# Patient Record
Sex: Male | Born: 1970 | Race: White | Hispanic: No | Marital: Married | Smoking: Never smoker
Health system: Southern US, Community
[De-identification: ages and names within clinical notes are randomized; demographics above are authoritative.]

## PROBLEM LIST (undated history)

## (undated) DIAGNOSIS — R0602 Shortness of breath: Secondary | ICD-10-CM

## (undated) DIAGNOSIS — M25472 Effusion, left ankle: Secondary | ICD-10-CM

## (undated) DIAGNOSIS — M25569 Pain in unspecified knee: Secondary | ICD-10-CM

## (undated) DIAGNOSIS — Z87442 Personal history of urinary calculi: Secondary | ICD-10-CM

## (undated) DIAGNOSIS — E785 Hyperlipidemia, unspecified: Secondary | ICD-10-CM

## (undated) DIAGNOSIS — Z8619 Personal history of other infectious and parasitic diseases: Secondary | ICD-10-CM

## (undated) DIAGNOSIS — M1990 Unspecified osteoarthritis, unspecified site: Secondary | ICD-10-CM

## (undated) DIAGNOSIS — K5900 Constipation, unspecified: Secondary | ICD-10-CM

## (undated) DIAGNOSIS — I10 Essential (primary) hypertension: Secondary | ICD-10-CM

## (undated) DIAGNOSIS — N200 Calculus of kidney: Secondary | ICD-10-CM

## (undated) DIAGNOSIS — M25475 Effusion, left foot: Secondary | ICD-10-CM

## (undated) DIAGNOSIS — E669 Obesity, unspecified: Secondary | ICD-10-CM

## (undated) DIAGNOSIS — R7301 Impaired fasting glucose: Secondary | ICD-10-CM

## (undated) DIAGNOSIS — Z8709 Personal history of other diseases of the respiratory system: Secondary | ICD-10-CM

## (undated) DIAGNOSIS — R5383 Other fatigue: Secondary | ICD-10-CM

## (undated) DIAGNOSIS — G4730 Sleep apnea, unspecified: Secondary | ICD-10-CM

## (undated) DIAGNOSIS — T7840XA Allergy, unspecified, initial encounter: Secondary | ICD-10-CM

## (undated) HISTORY — PX: WISDOM TOOTH EXTRACTION: SHX21

## (undated) HISTORY — DX: Shortness of breath: R06.02

## (undated) HISTORY — DX: Personal history of other diseases of the respiratory system: Z87.09

## (undated) HISTORY — DX: Effusion, left foot: M25.475

## (undated) HISTORY — DX: Personal history of urinary calculi: Z87.442

## (undated) HISTORY — DX: Hyperlipidemia, unspecified: E78.5

## (undated) HISTORY — DX: Unspecified osteoarthritis, unspecified site: M19.90

## (undated) HISTORY — DX: Pain in unspecified knee: M25.569

## (undated) HISTORY — DX: Personal history of other infectious and parasitic diseases: Z86.19

## (undated) HISTORY — DX: Essential (primary) hypertension: I10

## (undated) HISTORY — PX: FOOT SURGERY: SHX648

## (undated) HISTORY — DX: Sleep apnea, unspecified: G47.30

## (undated) HISTORY — DX: Obesity, unspecified: E66.9

## (undated) HISTORY — PX: OTHER SURGICAL HISTORY: SHX169

## (undated) HISTORY — DX: Impaired fasting glucose: R73.01

## (undated) HISTORY — PX: HERNIA REPAIR: SHX51

## (undated) HISTORY — DX: Other fatigue: R53.83

## (undated) HISTORY — DX: Allergy, unspecified, initial encounter: T78.40XA

## (undated) HISTORY — DX: Constipation, unspecified: K59.00

## (undated) HISTORY — DX: Effusion, left ankle: M25.472

## (undated) HISTORY — PX: APPENDECTOMY: SHX54

---

## 2012-11-06 ENCOUNTER — Ambulatory Visit (INDEPENDENT_AMBULATORY_CARE_PROVIDER_SITE_OTHER): Payer: BC Managed Care – PPO | Admitting: Family Medicine

## 2012-11-06 ENCOUNTER — Encounter: Payer: Self-pay | Admitting: Family Medicine

## 2012-11-06 VITALS — BP 123/85 | HR 80 | Wt 251.0 lb

## 2012-11-06 DIAGNOSIS — R7402 Elevation of levels of lactic acid dehydrogenase (LDH): Secondary | ICD-10-CM

## 2012-11-06 DIAGNOSIS — E8881 Metabolic syndrome: Secondary | ICD-10-CM

## 2012-11-06 DIAGNOSIS — R7401 Elevation of levels of liver transaminase levels: Secondary | ICD-10-CM | POA: Insufficient documentation

## 2012-11-06 DIAGNOSIS — E785 Hyperlipidemia, unspecified: Secondary | ICD-10-CM

## 2012-11-06 DIAGNOSIS — E88819 Insulin resistance, unspecified: Secondary | ICD-10-CM

## 2012-11-06 DIAGNOSIS — E669 Obesity, unspecified: Secondary | ICD-10-CM

## 2012-11-06 MED ORDER — ROSUVASTATIN CALCIUM 40 MG PO TABS: ORAL_TABLET | ORAL | Status: DC

## 2012-11-06 NOTE — Progress Notes (Signed)
Subjective:     Patient ID: Ronald Hansen, male   DOB: 1971-03-07, 42 y.o.   MRN: [id number]  HPI Ronald Hansen is here today to go over his most recent lab results.  He has done well since his last office visit.   Review of Systems  Constitutional: Negative for unexpected weight change.  Cardiovascular: Negative for chest pain and leg swelling.  Endocrine: Negative for polydipsia, polyphagia and polyuria.       Objective:   Physical Exam  Constitutional: He appears well-nourished. No distress.  Cardiovascular: Normal rate and regular rhythm.   Pulmonary/Chest: Effort normal and breath sounds normal.  Musculoskeletal: He exhibits no edema.  Skin:  Multiple tattoos       Assessment:     Hyperlipidemia Elevated Transaminase     Plan:     He is going to start on Crestor and we'll recheck his labs in 4 months.  He is to limit his intake of alcohol and is to follow a low carb diet.

## 2012-11-06 NOTE — Patient Instructions (Addendum)
1)  Cholesterol -   A)  Low Fat/Low Carb/High Protein Diet plus 1 hour of exercise per day.    B)  Crestor - Start with 10 mg at night and increase to 20 mg.  We'll recheck your NMR after you have been on the 20 mg for a full 3 months.     2)  Elevated Liver Enzyme-  Take 400 IU of Vitamin E twice a day till your next blood draw.  Avoid alcohol and excessive pain meds like Ibuprofen and Tylenol. Weight loss will help this.  Just to be sure that you don't need to worry about Hepatitis - Go to the [employer] and donate blood.    3)  Insulin Resistance - See above.      Fat and Cholesterol Control Diet Cholesterol levels in your body are determined significantly by your diet. Cholesterol levels may also be related to heart disease. The following material helps to explain this relationship and discusses what you can do to help keep your heart healthy. Not all cholesterol is bad. Low-density lipoprotein (LDL) cholesterol is the "bad" cholesterol. It may cause fatty deposits to build up inside your arteries. High-density lipoprotein (HDL) cholesterol is "good." It helps to remove the "bad" LDL cholesterol from your blood. Cholesterol is a very important risk factor for heart disease. Other risk factors are high blood pressure, smoking, stress, heredity, and weight. The heart muscle gets its supply of blood through the coronary arteries. If your LDL cholesterol is high and your HDL cholesterol is low, you are at risk for having fatty deposits build up in your coronary arteries. This leaves less room through which blood can flow. Without sufficient blood and oxygen, the heart muscle cannot function properly and you may feel chest pains (angina pectoris). When a coronary artery closes up entirely, a part of the heart muscle may die causing a heart attack (myocardial infarction). CHECKING CHOLESTEROL When your caregiver sends your blood to a lab to be examined for cholesterol, a complete lipid (fat)  profile may be done. With this test, the total amount of cholesterol and levels of LDL and HDL are determined. Triglycerides are a type of fat that circulates in the blood. They can also be used to determine heart disease risk. The list below describes what the numbers should be: Test: Total Cholesterol.  Less than 200 mg/dl. Test: LDL "bad cholesterol."  Less than 100 mg/dl.  Less than 70 mg/dl if you are at very high risk of a heart attack or sudden cardiac death. Test: HDL "good cholesterol."  Greater than 50 mg/dl for women.  Greater than 40 mg/dl for men. Test: Triglycerides.  Less than 150 mg/dl. CONTROLLING CHOLESTEROL WITH DIET Although exercise and lifestyle factors are important, your diet is key. That is because certain foods are known to raise cholesterol and others to lower it. The goal is to balance foods for their effect on cholesterol and more importantly, to replace saturated and trans fat with other types of fat, such as monounsaturated fat, polyunsaturated fat, and omega-3 fatty acids. On average, a person should consume no more than 15 to 17 g of saturated fat daily. Saturated and trans fats are considered "bad" fats, and they will raise LDL cholesterol. Saturated fats are primarily found in animal products such as meats, butter, and cream. However, that does not mean you need to give up all your favorite foods. Today, there are good tasting, low-fat, low-cholesterol substitutes for most of the things you  like to eat. Choose low-fat or nonfat alternatives. Choose round or loin cuts of red meat. These types of cuts are lowest in fat and cholesterol. Chicken (without the skin), fish, veal, and ground [place] breast are great choices. Eliminate fatty meats, such as hot dogs and salami. Even shellfish have little or no saturated fat. Have a 3 oz (85 g) portion when you eat lean meat, poultry, or fish. Trans fats are also called "partially hydrogenated oils." They are oils that have  been scientifically manipulated so that they are solid at room temperature resulting in a longer shelf life and improved taste and texture of foods in which they are added. Trans fats are found in stick margarine, some tub margarines, cookies, crackers, and baked goods.  When baking and cooking, oils are a great substitute for butter. The monounsaturated oils are especially beneficial since it is believed they lower LDL and raise HDL. The oils you should avoid entirely are saturated tropical oils, such as coconut and palm.  Remember to eat a lot from food groups that are naturally free of saturated and trans fat, including fish, fruit, vegetables, beans, grains (barley, rice, couscous, bulgur wheat), and pasta (without cream sauces).  IDENTIFYING FOODS THAT LOWER CHOLESTEROL  Soluble fiber may lower your cholesterol. This type of fiber is found in fruits such as apples, vegetables such as broccoli, potatoes, and carrots, legumes such as beans, peas, and lentils, and grains such as barley. Foods fortified with plant sterols (phytosterol) may also lower cholesterol. You should eat at least 2 g per day of these foods for a cholesterol lowering effect.  Read package labels to identify low-saturated fats, trans fat free, and low-fat foods at the supermarket. Select cheeses that have only 2 to 3 g saturated fat per ounce. Use a heart-healthy tub margarine that is free of trans fats or partially hydrogenated oil. When buying baked goods (cookies, crackers), avoid partially hydrogenated oils. Breads and muffins should be made from whole grains (whole-wheat or whole oat flour, instead of "flour" or "enriched flour"). Buy non-creamy canned soups with reduced salt and no added fats.  FOOD PREPARATION TECHNIQUES  Never deep-fry. If you must fry, either stir-fry, which uses very little fat, or use non-stick cooking sprays. When possible, broil, bake, or roast meats, and steam vegetables. Instead of putting butter or  margarine on vegetables, use lemon and herbs, applesauce, and cinnamon (for squash and sweet potatoes), nonfat yogurt, salsa, and low-fat dressings for salads.  LOW-SATURATED FAT / LOW-FAT FOOD SUBSTITUTES Meats / Saturated Fat (g)  Avoid: Steak, marbled (3 oz/85 g) / 11 g  Choose: Steak, lean (3 oz/85 g) / 4 g  Avoid: Hamburger (3 oz/85 g) / 7 g  Choose: Hamburger, lean (3 oz/85 g) / 5 g  Avoid: Ham (3 oz/85 g) / 6 g  Choose: Ham, lean cut (3 oz/85 g) / 2.4 g  Avoid: Chicken, with skin, dark meat (3 oz/85 g) / 4 g  Choose: Chicken, skin removed, dark meat (3 oz/85 g) / 2 g  Avoid: Chicken, with skin, light meat (3 oz/85 g) / 2.5 g  Choose: Chicken, skin removed, light meat (3 oz/85 g) / 1 g Dairy / Saturated Fat (g)  Avoid: Whole milk (1 cup) / 5 g  Choose: Low-fat milk, 2% (1 cup) / 3 g  Choose: Low-fat milk, 1% (1 cup) / 1.5 g  Choose: Skim milk (1 cup) / 0.3 g  Avoid: Hard cheese (1 oz/28 g) / 6 g  Choose: Skim milk cheese (1 oz/28 g) / 2 to 3 g  Avoid: Cottage cheese, 4% fat (1 cup) / 6.5 g  Choose: Low-fat cottage cheese, 1% fat (1 cup) / 1.5 g  Avoid: Ice cream (1 cup) / 9 g  Choose: Sherbet (1 cup) / 2.5 g  Choose: Nonfat frozen yogurt (1 cup) / 0.3 g  Choose: Frozen fruit bar / trace  Avoid: Whipped cream (1 tbs) / 3.5 g  Choose: Nondairy whipped topping (1 tbs) / 1 g Condiments / Saturated Fat (g)  Avoid: Mayonnaise (1 tbs) / 2 g  Choose: Low-fat mayonnaise (1 tbs) / 1 g  Avoid: Butter (1 tbs) / 7 g  Choose: Extra light margarine (1 tbs) / 1 g  Avoid: Coconut oil (1 tbs) / 11.8 g  Choose: Olive oil (1 tbs) / 1.8 g  Choose: Corn oil (1 tbs) / 1.7 g  Choose: Safflower oil (1 tbs) / 1.2 g  Choose: Sunflower oil (1 tbs) / 1.4 g  Choose: Soybean oil (1 tbs) / 2.4 g  Choose: Canola oil (1 tbs) / 1 g Document Released: 07/23/2005 Document Revised: 10/15/2011 Document Reviewed: 01/11/2011 [facility] Patient Information 2013 [place],  [place].

## 2012-11-08 ENCOUNTER — Encounter: Payer: Self-pay | Admitting: Family Medicine

## 2012-12-18 ENCOUNTER — Other Ambulatory Visit: Payer: Self-pay | Admitting: *Deleted

## 2012-12-18 NOTE — Telephone Encounter (Signed)
Called [facility] to confirm the refill for Crestor for 90 tablets with 3 refills. PG

## 2013-03-09 ENCOUNTER — Other Ambulatory Visit: Payer: Self-pay | Admitting: *Deleted

## 2013-03-09 DIAGNOSIS — E785 Hyperlipidemia, unspecified: Secondary | ICD-10-CM

## 2013-03-11 ENCOUNTER — Other Ambulatory Visit: Payer: BC Managed Care – PPO

## 2013-03-18 ENCOUNTER — Ambulatory Visit: Payer: BC Managed Care – PPO | Admitting: Family Medicine

## 2014-02-14 ENCOUNTER — Encounter (HOSPITAL_BASED_OUTPATIENT_CLINIC_OR_DEPARTMENT_OTHER): Payer: Self-pay | Admitting: Emergency Medicine

## 2014-02-14 ENCOUNTER — Emergency Department (HOSPITAL_BASED_OUTPATIENT_CLINIC_OR_DEPARTMENT_OTHER)
Admission: EM | Admit: 2014-02-14 | Discharge: 2014-02-14 | Disposition: A | Discharge status: Home / Self Care | Payer: BC Managed Care – PPO | Attending: Emergency Medicine | Admitting: Emergency Medicine

## 2014-02-14 ENCOUNTER — Emergency Department (HOSPITAL_BASED_OUTPATIENT_CLINIC_OR_DEPARTMENT_OTHER): Payer: BC Managed Care – PPO

## 2014-02-14 DIAGNOSIS — Z791 Long term (current) use of non-steroidal anti-inflammatories (NSAID): Secondary | ICD-10-CM | POA: Insufficient documentation

## 2014-02-14 DIAGNOSIS — E785 Hyperlipidemia, unspecified: Secondary | ICD-10-CM | POA: Insufficient documentation

## 2014-02-14 DIAGNOSIS — E669 Obesity, unspecified: Secondary | ICD-10-CM | POA: Insufficient documentation

## 2014-02-14 DIAGNOSIS — Z8739 Personal history of other diseases of the musculoskeletal system and connective tissue: Secondary | ICD-10-CM | POA: Insufficient documentation

## 2014-02-14 DIAGNOSIS — N23 Unspecified renal colic: Secondary | ICD-10-CM | POA: Insufficient documentation

## 2014-02-14 DIAGNOSIS — Z79899 Other long term (current) drug therapy: Secondary | ICD-10-CM | POA: Insufficient documentation

## 2014-02-14 DIAGNOSIS — Z880 Allergy status to penicillin: Secondary | ICD-10-CM | POA: Insufficient documentation

## 2014-02-14 HISTORY — DX: Calculus of kidney: N20.0

## 2014-02-14 HISTORY — DX: Unspecified osteoarthritis, unspecified site: M19.90

## 2014-02-14 LAB — CBC WITH DIFFERENTIAL/PLATELET
Basophils Absolute: 0 10*3/uL (ref 0.0–0.1)
Basophils Relative: 1 % (ref 0–1)
EOS ABS: 0.2 10*3/uL (ref 0.0–0.7)
Eosinophils Relative: 2 % (ref 0–5)
HCT: 46.8 % (ref 39.0–52.0)
Hemoglobin: 16.6 g/dL (ref 13.0–17.0)
LYMPHS ABS: 1.7 10*3/uL (ref 0.7–4.0)
Lymphocytes Relative: 23 % (ref 12–46)
MCH: 31.1 pg (ref 26.0–34.0)
MCHC: 35.5 g/dL (ref 30.0–36.0)
MCV: 87.8 fL (ref 78.0–100.0)
MONOS PCT: 9 % (ref 3–12)
Monocytes Absolute: 0.6 10*3/uL (ref 0.1–1.0)
NEUTROS ABS: 4.8 10*3/uL (ref 1.7–7.7)
Neutrophils Relative %: 66 % (ref 43–77)
Platelets: 205 10*3/uL (ref 150–400)
RBC: 5.33 MIL/uL (ref 4.22–5.81)
RDW: 12.3 % (ref 11.5–15.5)
WBC: 7.3 10*3/uL (ref 4.0–10.5)

## 2014-02-14 LAB — URINE MICROSCOPIC-ADD ON

## 2014-02-14 LAB — URINALYSIS, ROUTINE W REFLEX MICROSCOPIC
Bilirubin Urine: NEGATIVE
Glucose, UA: NEGATIVE mg/dL
Ketones, ur: NEGATIVE mg/dL
Leukocytes, UA: NEGATIVE
NITRITE: NEGATIVE
Protein, ur: NEGATIVE mg/dL
SPECIFIC GRAVITY, URINE: 1.02 (ref 1.005–1.030)
UROBILINOGEN UA: 0.2 mg/dL (ref 0.0–1.0)
pH: 6 (ref 5.0–8.0)

## 2014-02-14 LAB — BASIC METABOLIC PANEL
Anion gap: 12 (ref 5–15)
BUN: 9 mg/dL (ref 6–23)
CHLORIDE: 102 meq/L (ref 96–112)
CO2: 28 meq/L (ref 19–32)
Calcium: 9.7 mg/dL (ref 8.4–10.5)
Creatinine, Ser: 1 mg/dL (ref 0.50–1.35)
GFR calc Af Amer: >90 mL/min (ref >90)
GFR calc non Af Amer: >90 mL/min (ref >90)
GLUCOSE: 125 mg/dL — AB (ref 70–99)
POTASSIUM: 3.7 meq/L (ref 3.7–5.3)
Sodium: 142 mEq/L (ref 137–147)

## 2014-02-14 MED ORDER — KETOROLAC TROMETHAMINE 30 MG/ML IJ SOLN
30.0000 mg | Freq: Once | INTRAMUSCULAR | Status: AC
  Administered 2014-02-14: 30 mg via INTRAVENOUS
  Filled 2014-02-14: qty 1

## 2014-02-14 MED ORDER — OXYCODONE-ACETAMINOPHEN 5-325 MG PO TABS: 2.0000 | ORAL_TABLET | ORAL | Status: DC | PRN

## 2014-02-14 MED ORDER — MORPHINE SULFATE 4 MG/ML IJ SOLN
4.0000 mg | Freq: Once | INTRAMUSCULAR | Status: AC
  Administered 2014-02-14: 4 mg via INTRAVENOUS
  Filled 2014-02-14: qty 1

## 2014-02-14 MED ORDER — ONDANSETRON HCL 4 MG/2ML IJ SOLN
4.0000 mg | Freq: Once | INTRAMUSCULAR | Status: AC
  Administered 2014-02-14: 4 mg via INTRAVENOUS
  Filled 2014-02-14: qty 2

## 2014-02-14 MED ORDER — ONDANSETRON HCL 4 MG PO TABS: 4.0000 mg | ORAL_TABLET | Freq: Four times a day (QID) | ORAL | Status: DC

## 2014-02-14 MED ORDER — IBUPROFEN 800 MG PO TABS: 800.0000 mg | ORAL_TABLET | Freq: Three times a day (TID) | ORAL | Status: DC

## 2014-02-14 MED ORDER — SODIUM CHLORIDE 0.9 % IV BOLUS (SEPSIS)
1000.0000 mL | Freq: Once | INTRAVENOUS | Status: AC
  Administered 2014-02-14: 1000 mL via INTRAVENOUS

## 2014-02-14 NOTE — Discharge Instructions (Signed)

## 2014-02-14 NOTE — ED Notes (Signed)
Pt reports pain in L flank with radiation to groin area, dysuria and blood in urine.  Reports hx of kidney stones.  Also has had constipation.

## 2014-02-14 NOTE — ED Provider Notes (Signed)
CSN: [id number]     Arrival date & time 02/14/14  1612 History  This chart was scribed for Glynn OctaveStephen Tayveon Lombardo, MD by Modena JanskyAlbert Thayil, ED Scribe. This patient was seen in room MH01/MH01 and the patient's care was started at 4:30 PM.  Chief Complaint  Patient presents with  . Flank Pain    HPI HPI Comments: Ronald Hansen is a 43 y.o. male with a hx of kidney stones who presents to the Emergency Department complaining of intermittent moderate left flank abdominal pain that started two days ago. He states that the pain radiates that his groin area. He reports associated dysuria and hematuria. He also reports associated chills. He reports that his symptoms feel similar to prior episodes of kidney stones He states that he has a hx of an appendectomy. He denies any emesis or testicular pain. He states that he does not have a urologist.    PCP- Dr. Alberteen SamZanard Past Medical History  Diagnosis Date  . Hyperlipidemia   . Obesity   . Impaired fasting glucose   . Kidney stone   . Arthritis    Past Surgical History  Procedure Laterality Date  . Appendectomy    . Cyst removal breast     Family History  Problem Relation Age of Onset  . COPD Father   . Cancer Father 1250    Prostate Cancer   . Hyperlipidemia Paternal Grandmother    History  Substance Use Topics  . Smoking status: Never Smoker   . Smokeless tobacco: Not on file  . Alcohol Use: Yes     Comment: occ    Review of Systems  A complete 10 system review of systems was obtained and all systems are negative except as noted in the HPI and PMH.   Allergies  Aspirin and Penicillins  Home Medications   Prior to Admission medications   Medication Sig Start Date End Date Taking? Authorizing Provider  ibuprofen (ADVIL,MOTRIN) 800 MG tablet Take 1 tablet (800 mg total) by mouth 3 (three) times daily. 02/14/14   Glynn OctaveStephen Derrien Anschutz, MD  ondansetron (ZOFRAN) 4 MG tablet Take 1 tablet (4 mg total) by mouth every 6 (six) hours. 02/14/14   Glynn OctaveStephen  Briyah Wheelwright, MD  oxyCODONE-acetaminophen (PERCOCET/ROXICET) 5-325 MG per tablet Take 2 tablets by mouth every 4 (four) hours as needed for severe pain. 02/14/14   Glynn OctaveStephen Jonisha Kindig, MD  rosuvastatin (CRESTOR) 40 MG tablet Take 1/2 - 1 tab daily 11/06/12   Gillian Scarceobyn K Zanard, MD   BP 156/103  Pulse 90  Temp(Src) 97.7 F (36.5 C) (Oral)  Resp 18  SpO2 99% Physical Exam  Nursing note and vitals reviewed. Constitutional: He is oriented to person, place, and time. He appears well-developed and well-nourished. No distress.  Uncomfortable.  HENT:  Head: Normocephalic and atraumatic.  Mouth/Throat: Oropharynx is clear and moist. No oropharyngeal exudate.  Eyes: Conjunctivae and EOM are normal. Pupils are equal, round, and reactive to light.  Neck: Normal range of motion. Neck supple.  No meningismus.  Cardiovascular: Normal rate, regular rhythm, normal heart sounds and intact distal pulses.   No murmur heard. Pulmonary/Chest: Effort normal and breath sounds normal. No respiratory distress.  Abdominal: Soft. There is tenderness. There is no rebound and no guarding.  Left lower abdominal pain with no guarding.  Genitourinary:  No testicular pain.  Musculoskeletal: Normal range of motion. He exhibits tenderness. He exhibits no edema.  Left paraspinal lumbar tenderness.   Neurological: He is alert and oriented to person, place, and time.  No cranial nerve deficit. He exhibits normal muscle tone. Coordination normal.  No ataxia on finger to nose bilaterally. No pronator drift. 5/5 strength throughout. CN 2-12 intact. Negative Romberg. Equal grip strength. Sensation intact. Gait is normal.   Skin: Skin is warm.  Psychiatric: He has a normal mood and affect. His behavior is normal.    ED Course  Procedures (including critical care time) DIAGNOSTIC STUDIES: Oxygen Saturation is 99% on RA, normal by my interpretation.    COORDINATION OF CARE: 4:34 PM- Pt advised of plan for treatment which includes  medication, labs, and radiology and pt agrees.  Labs Review Labs Reviewed  URINALYSIS, ROUTINE W REFLEX MICROSCOPIC - Abnormal; Notable for the following:    Hgb urine dipstick MODERATE (*)    All other components within normal limits  BASIC METABOLIC PANEL - Abnormal; Notable for the following:    Glucose, Bld 125 (*)    All other components within normal limits  URINE MICROSCOPIC-ADD ON - Abnormal; Notable for the following:    Bacteria, UA FEW (*)    All other components within normal limits  CBC WITH DIFFERENTIAL    Imaging Review Ct Abdomen Pelvis Wo Contrast  02/14/2014   CLINICAL DATA:  Left flank pain radiating to the groin.  Dysuria.  EXAM: CT ABDOMEN AND PELVIS WITHOUT CONTRAST  TECHNIQUE: Multidetector CT imaging of the abdomen and pelvis was performed following the standard protocol without IV contrast.  COMPARISON:  None.  FINDINGS: The lung bases are clear.  No pleural or pericardial effusion.  3 nonobstructing left renal stones are identified measuring up to 0.4 cm in diameter. There is mild left hydronephrosis with some stranding about the left kidney and ureter due to a 0.2 cm distal left ureteral stone. No right renal stones are seen.  The gallbladder, liver, spleen, adrenal glands, pancreas and biliary tree all appear normal. Fat containing inguinal hernias are seen, larger on the left. The stomach and small and large bowel appear normal. Bones demonstrate bilateral L5 pars interarticularis defects with associated trace anterolisthesis L5 on S1. No lytic or sclerotic bony lesion is seen.  IMPRESSION: Mild left hydronephrosis due to a 0.2 cm distal left ureteral stone. Three nonobstructing stones are also seen in the left kidney.  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  Fat containing inguinal hernias, larger on the left.   Electronically Signed   By: Drusilla Kanner M.D.   On: 02/14/2014 17:44     EKG Interpretation None       MDM   Final diagnoses:  Ureteral colic   intermittent left flank pain for the past day with hematuria. Similar to previous kidney stones. No vomiting or fever. No testicular pain  UA with hematuria, no infection  CT scan shows distal 2 mm stone on the left with hydronephrosis. Creatinine is normal. Pain well-controlled in the ED. No vomiting. Tolerating by mouth. Expect patient to pass stone without intervention. Will treat symptoms and refer to urology. Return precautions discussed  I personally performed the services described in this documentation, which was scribed in my presence. The recorded information has been reviewed and is accurate.     Glynn Octave, MD 02/14/14 [phone]

## 2015-08-28 ENCOUNTER — Emergency Department (HOSPITAL_BASED_OUTPATIENT_CLINIC_OR_DEPARTMENT_OTHER)
Admission: EM | Admit: 2015-08-28 | Discharge: 2015-08-28 | Disposition: A | Discharge status: Home / Self Care | Payer: BLUE CROSS/BLUE SHIELD | Attending: Emergency Medicine | Admitting: Emergency Medicine

## 2015-08-28 ENCOUNTER — Emergency Department (HOSPITAL_BASED_OUTPATIENT_CLINIC_OR_DEPARTMENT_OTHER): Payer: BLUE CROSS/BLUE SHIELD

## 2015-08-28 ENCOUNTER — Encounter (HOSPITAL_BASED_OUTPATIENT_CLINIC_OR_DEPARTMENT_OTHER): Payer: Self-pay | Admitting: *Deleted

## 2015-08-28 DIAGNOSIS — Z791 Long term (current) use of non-steroidal anti-inflammatories (NSAID): Secondary | ICD-10-CM | POA: Diagnosis not present

## 2015-08-28 DIAGNOSIS — Z79899 Other long term (current) drug therapy: Secondary | ICD-10-CM | POA: Diagnosis not present

## 2015-08-28 DIAGNOSIS — N50819 Testicular pain, unspecified: Secondary | ICD-10-CM | POA: Diagnosis not present

## 2015-08-28 DIAGNOSIS — M1990 Unspecified osteoarthritis, unspecified site: Secondary | ICD-10-CM | POA: Insufficient documentation

## 2015-08-28 DIAGNOSIS — K4090 Unilateral inguinal hernia, without obstruction or gangrene, not specified as recurrent: Secondary | ICD-10-CM | POA: Diagnosis not present

## 2015-08-28 DIAGNOSIS — K5900 Constipation, unspecified: Secondary | ICD-10-CM | POA: Insufficient documentation

## 2015-08-28 DIAGNOSIS — R1032 Left lower quadrant pain: Secondary | ICD-10-CM

## 2015-08-28 DIAGNOSIS — R197 Diarrhea, unspecified: Secondary | ICD-10-CM | POA: Diagnosis not present

## 2015-08-28 DIAGNOSIS — E669 Obesity, unspecified: Secondary | ICD-10-CM | POA: Diagnosis not present

## 2015-08-28 DIAGNOSIS — E785 Hyperlipidemia, unspecified: Secondary | ICD-10-CM | POA: Diagnosis not present

## 2015-08-28 DIAGNOSIS — Z880 Allergy status to penicillin: Secondary | ICD-10-CM | POA: Diagnosis not present

## 2015-08-28 DIAGNOSIS — Z87442 Personal history of urinary calculi: Secondary | ICD-10-CM | POA: Insufficient documentation

## 2015-08-28 DIAGNOSIS — R1030 Lower abdominal pain, unspecified: Secondary | ICD-10-CM | POA: Diagnosis present

## 2015-08-28 LAB — COMPREHENSIVE METABOLIC PANEL
ALK PHOS: 83 U/L (ref 38–126)
ALT: 36 U/L (ref 17–63)
AST: 24 U/L (ref 15–41)
Albumin: 3.8 g/dL (ref 3.5–5.0)
Anion gap: 7 (ref 5–15)
BUN: 15 mg/dL (ref 6–20)
CALCIUM: 8.8 mg/dL — AB (ref 8.9–10.3)
CO2: 29 mmol/L (ref 22–32)
CREATININE: 0.93 mg/dL (ref 0.61–1.24)
Chloride: 104 mmol/L (ref 101–111)
GFR calc Af Amer: >60 mL/min (ref >60)
GFR calc non Af Amer: >60 mL/min (ref >60)
GLUCOSE: 113 mg/dL — AB (ref 65–99)
POTASSIUM: 4.1 mmol/L (ref 3.5–5.1)
Sodium: 140 mmol/L (ref 135–145)
Total Bilirubin: 0.7 mg/dL (ref 0.3–1.2)
Total Protein: 6.6 g/dL (ref 6.5–8.1)

## 2015-08-28 LAB — CBC WITH DIFFERENTIAL/PLATELET
BASOS ABS: 0 10*3/uL (ref 0.0–0.1)
Band Neutrophils: 0 %
Basophils Relative: 0 %
Blasts: 0 %
Eosinophils Absolute: 0.1 10*3/uL (ref 0.0–0.7)
Eosinophils Relative: 2 %
HCT: 45.5 % (ref 39.0–52.0)
Hemoglobin: 15.7 g/dL (ref 13.0–17.0)
LYMPHS PCT: 33 %
Lymphs Abs: 2.2 10*3/uL (ref 0.7–4.0)
MCH: 30.5 pg (ref 26.0–34.0)
MCHC: 34.5 g/dL (ref 30.0–36.0)
MCV: 88.5 fL (ref 78.0–100.0)
MONOS PCT: 8 %
Metamyelocytes Relative: 1 %
Monocytes Absolute: 0.5 10*3/uL (ref 0.1–1.0)
Myelocytes: 1 %
NEUTROS ABS: 3.8 10*3/uL (ref 1.7–7.7)
Neutrophils Relative %: 55 %
Other: 0 %
PROMYELOCYTES ABS: 0 %
Platelets: 186 10*3/uL (ref 150–400)
RBC: 5.14 MIL/uL (ref 4.22–5.81)
RDW: 12.7 % (ref 11.5–15.5)
WBC: 6.6 10*3/uL (ref 4.0–10.5)
nRBC: 0 /100 WBC

## 2015-08-28 MED ORDER — SODIUM CHLORIDE 0.9 % IV BOLUS (SEPSIS)
500.0000 mL | Freq: Once | INTRAVENOUS | Status: AC
  Administered 2015-08-28: 500 mL via INTRAVENOUS

## 2015-08-28 MED ORDER — IOHEXOL 350 MG/ML SOLN
85.0000 mL | Freq: Once | INTRAVENOUS | Status: AC | PRN
Start: 2015-08-28 — End: 2015-08-28
  Administered 2015-08-28: 100 mL via INTRAVENOUS

## 2015-08-28 MED ORDER — IOHEXOL 300 MG/ML  SOLN
25.0000 mL | Freq: Once | INTRAMUSCULAR | Status: AC | PRN
  Administered 2015-08-28: 25 mL via ORAL

## 2015-08-28 MED ORDER — ONDANSETRON HCL 4 MG/2ML IJ SOLN
4.0000 mg | Freq: Once | INTRAMUSCULAR | Status: AC
  Administered 2015-08-28: 4 mg via INTRAVENOUS
  Filled 2015-08-28: qty 2

## 2015-08-28 NOTE — ED Notes (Signed)
No difficulty voiding. Pain onset occurred while walking around.

## 2015-08-28 NOTE — ED Notes (Signed)
DC instructions reviewed with pt and significant other. Information sheet provided with numbers to inquire about obtaining a Primary Care MD, Discussed also the importance calling to make follow up appoint with [facility] as per EDP recommendations. Discussed and reviewed times that indicate return and seek assistance in the ED. Opportunity for questions provided. Teach Back Method used. Restricted activity work note provided by EDP and given to patient.

## 2015-08-28 NOTE — ED Notes (Signed)
Presents today with c/o pain at left groin, noted this past Thursday, Friday pm the pain was worse, unable to sleep and lay on left side. Describes as having a dull throb.

## 2015-08-28 NOTE — ED Provider Notes (Signed)
CSN: [id number]     Arrival date & time 08/28/15  1205 History   First MD Initiated Contact with Patient 08/28/15 1313     Chief Complaint  Patient presents with  . Groin Pain     (Consider location/radiation/quality/duration/timing/severity/associated sxs/prior Treatment) HPI Comments: Began Thursday night, twinges then Friday when it became more severe Yesterday at work was moving things, had sharp stabbing pain which was there and gone 3/10 right now, coming and going Yesterday stabbing pain while lifting, severe in left scrotum, radiates towards groin, then feel nausea, aching pain in groin "after effect" Worse with sneeze/lifting Feels different than kidney stones   Patient is a 45 y.o. male presenting with groin pain.  Groin Pain Pertinent negatives include no chest pain, no abdominal pain, no headaches and no shortness of breath.    Past Medical History  Diagnosis Date  . Hyperlipidemia   . Obesity   . Impaired fasting glucose   . Kidney stone   . Arthritis    Past Surgical History  Procedure Laterality Date  . Appendectomy    . Cyst removal breast    . Other surgical history Left     bone graft left ankle   Family History  Problem Relation Age of Onset  . COPD Father   . Cancer Father 53    Prostate Cancer   . Hyperlipidemia Paternal Grandmother    Social History  Substance Use Topics  . Smoking status: Never Smoker   . Smokeless tobacco: Current User    Types: Snuff  . Alcohol Use: Yes     Comment: occ    Review of Systems  Constitutional: Negative for fever.  HENT: Negative for sore throat.   Eyes: Negative for visual disturbance.  Respiratory: Negative for shortness of breath.   Cardiovascular: Negative for chest pain.  Gastrointestinal: Positive for nausea. Negative for vomiting, abdominal pain, diarrhea (alternating) and constipation (alternating ).  Genitourinary: Positive for testicular pain. Negative for dysuria, hematuria, discharge,  scrotal swelling and difficulty urinating.  Musculoskeletal: Negative for back pain and neck stiffness.  Skin: Negative for rash.  Neurological: Negative for syncope and headaches.      Allergies  Aspirin and Penicillins  Home Medications   Prior to Admission medications   Medication Sig Start Date End Date Taking? Authorizing Provider  ibuprofen (ADVIL,MOTRIN) 800 MG tablet Take 1 tablet (800 mg total) by mouth 3 (three) times daily. 02/14/14   Glynn Octave, MD  ondansetron (ZOFRAN) 4 MG tablet Take 1 tablet (4 mg total) by mouth every 6 (six) hours. 02/14/14   Glynn Octave, MD  oxyCODONE-acetaminophen (PERCOCET/ROXICET) 5-325 MG per tablet Take 2 tablets by mouth every 4 (four) hours as needed for severe pain. 02/14/14   Glynn Octave, MD  rosuvastatin (CRESTOR) 40 MG tablet Take 1/2 - 1 tab daily 11/06/12   Gillian Scarce, MD   BP 149/90 mmHg  Pulse 66  Temp(Src) 98 F (36.7 C)  Resp 18  SpO2 99% Physical Exam  Constitutional: He is oriented to person, place, and time. He appears well-developed and well-nourished. No distress.  HENT:  Head: Normocephalic and atraumatic.  Eyes: Conjunctivae and EOM are normal.  Neck: Normal range of motion.  Cardiovascular: Normal rate, regular rhythm, normal heart sounds and intact distal pulses.  Exam reveals no gallop and no friction rub.   No murmur heard. Pulmonary/Chest: Effort normal and breath sounds normal. No respiratory distress. He has no wheezes. He has no rales.  Abdominal: Soft. He  exhibits no distension. There is no tenderness. There is no guarding. A hernia is present. Hernia confirmed positive in the left inguinal area.  Genitourinary: Right testis shows no mass and no tenderness. Right testis is descended. Cremasteric reflex is not absent on the right side. Left testis shows no mass, no swelling and no tenderness. Left testis is descended. Cremasteric reflex is not absent on the left side.  Musculoskeletal: He exhibits no  edema.  Neurological: He is alert and oriented to person, place, and time.  Skin: Skin is warm and dry. He is not diaphoretic.  Nursing note and vitals reviewed.   ED Course  Procedures (including critical care time) Labs Review Labs Reviewed  COMPREHENSIVE METABOLIC PANEL - Abnormal; Notable for the following:    Glucose, Bld 113 (*)    Calcium 8.8 (*)    All other components within normal limits  CBC WITH DIFFERENTIAL/PLATELET    Imaging Review Ct Abdomen Pelvis W Contrast  08/28/2015  CLINICAL DATA:  Left groin pain EXAM: CT ABDOMEN AND PELVIS WITH CONTRAST TECHNIQUE: Multidetector CT imaging of the abdomen and pelvis was performed using the standard protocol following bolus administration of intravenous contrast. CONTRAST:  OMNIPAQUE IOHEXOL 350 MG/ML SOLN, 25mL OMNIPAQUE IOHEXOL 300 MG/ML SOLN COMPARISON:  02/14/2014 FINDINGS: Lung bases are free of acute infiltrate or sizable effusion. The liver, gallbladder, spleen, adrenal glands and pancreas are all normal in their CT appearance. Kidneys demonstrate a normal enhancement pattern. Nonobstructing stones are noted on the left. The largest of these measures 4 mm in greatest dimension. No obstructive changes are noted. The appendix has been surgically removed. The bladder is well distended. No pelvic mass lesion is noted. A fat containing left inguinal hernia is seen but stable from the prior exam of 2015. No bowel is noted within. Bony structures are within normal limits. IMPRESSION: Fat containing left inguinal hernia but stable from the prior exam of 2015. Nonobstructing left renal stones. Electronically Signed   By: Alcide Clever M.D.   On: 08/28/2015 15:43   I have personally reviewed and evaluated these images and lab results as part of my medical decision-making.   EKG Interpretation None      MDM   Final diagnoses:  Left groin pain  Left inguinal hernia   45 year old male with history of hyperlipidemia,  nephrolithiasis presents with concern of intermittent left groin pain. Exam and CT show inguinal hernia, fat containing by CT.  Patient hemodynamically stable, pain free at this time.   No testicular tenderness, and feel groin pain is likely secondary to inguinal hernia with intermittent worsening with valsalva/lifting and doubt torsion/epididimitis. No persistent pain in the ED. Discussed return precautions, recommend outpatient surgery evaluation. Patient discharged in stable condition with understanding of reasons to return.   Alvira Monday, MD 08/28/15 [phone]

## 2015-08-28 NOTE — ED Notes (Signed)
Left groin tender to touch, no obvious swelling or deformity noted at this time

## 2015-08-28 NOTE — ED Notes (Signed)
Pt reports left groin pain since Thursday. ?hernia. Works for [employer] frequently

## 2015-09-08 ENCOUNTER — Ambulatory Visit: Payer: Self-pay | Admitting: General Surgery

## 2016-03-14 ENCOUNTER — Encounter (HOSPITAL_BASED_OUTPATIENT_CLINIC_OR_DEPARTMENT_OTHER): Payer: Self-pay | Admitting: Emergency Medicine

## 2016-03-14 ENCOUNTER — Emergency Department (HOSPITAL_BASED_OUTPATIENT_CLINIC_OR_DEPARTMENT_OTHER)
Admission: EM | Admit: 2016-03-14 | Discharge: 2016-03-14 | Disposition: A | Discharge status: Home / Self Care | Payer: BLUE CROSS/BLUE SHIELD | Attending: Emergency Medicine | Admitting: Emergency Medicine

## 2016-03-14 DIAGNOSIS — M545 Low back pain, unspecified: Secondary | ICD-10-CM

## 2016-03-14 LAB — URINALYSIS, ROUTINE W REFLEX MICROSCOPIC
BILIRUBIN URINE: NEGATIVE
Glucose, UA: NEGATIVE mg/dL
Hgb urine dipstick: NEGATIVE
KETONES UR: NEGATIVE mg/dL
LEUKOCYTES UA: NEGATIVE
NITRITE: NEGATIVE
PH: 5.5 (ref 5.0–8.0)
Protein, ur: NEGATIVE mg/dL
SPECIFIC GRAVITY, URINE: 1.025 (ref 1.005–1.030)

## 2016-03-14 MED ORDER — OXYCODONE-ACETAMINOPHEN 5-325 MG PO TABS
1.0000 | ORAL_TABLET | Freq: Once | ORAL | Status: AC
  Administered 2016-03-14: 1 via ORAL
  Filled 2016-03-14: qty 1

## 2016-03-14 MED ORDER — KETOROLAC TROMETHAMINE 60 MG/2ML IM SOLN
60.0000 mg | Freq: Once | INTRAMUSCULAR | Status: AC
  Administered 2016-03-14: 60 mg via INTRAMUSCULAR
  Filled 2016-03-14: qty 2

## 2016-03-14 MED ORDER — NAPROXEN 500 MG PO TABS: 500.0000 mg | ORAL_TABLET | Freq: Two times a day (BID) | ORAL | 0 refills | Status: DC

## 2016-03-14 MED ORDER — OXYCODONE-ACETAMINOPHEN 5-325 MG PO TABS: 1.0000 | ORAL_TABLET | Freq: Four times a day (QID) | ORAL | 0 refills | Status: DC | PRN

## 2016-03-14 MED ORDER — TIZANIDINE HCL 4 MG PO TABS: 4.0000 mg | ORAL_TABLET | Freq: Four times a day (QID) | ORAL | 0 refills | Status: DC | PRN

## 2016-03-14 MED FILL — tiZANidine HCL 4 MG TABS: 4 | 2 days supply | Qty: 8 | Fill #0

## 2016-03-14 MED FILL — NAPROXEN 500 MG TABLET: 500 | 7 days supply | Qty: 14 | Fill #0

## 2016-03-14 MED FILL — OXYCODONE/APAP 5-325: 5-325 | 2 days supply | Qty: 8 | Fill #0

## 2016-03-14 NOTE — ED Triage Notes (Signed)
Back pain since Sunday and today locked up in shower

## 2016-03-14 NOTE — ED Notes (Signed)
Pt and SO given d/c instructions as per chart. Rx x 3. Verbalizes understanding. No questions. Narc prec and Tylenol precautions given.

## 2016-03-14 NOTE — ED Provider Notes (Signed)
[facility] Provider Note   CSN: [id number] Arrival date & time: 03/14/16  1310  First Provider Contact:  First MD Initiated Contact with Patient 03/14/16 1406        History   Chief Complaint Chief Complaint  Patient presents with  . Back Pain    HPI Ronald Hansen is a 45 y.o. male.  The history is provided by the patient. No language interpreter was used.  Back Pain     Ronald Hansen is a 45 y.o. male who presents to the Emergency Department complaining of back pain.  He reports two days of low back pain.  Pain was gradual in onset and described as a sharp sensation.  Pain is worse with movement and waxing and waning in nature.  Today he was in the shower and felt "locked up" in his back.  The pain started in the right low back but is now midline and more on the left low back.  He has a hx/o multiple kidney stones (20+) and is concerned this may be an additional kidney stone.  He denies fever, chest pain, sob, dysuria, numbness, weakness.    Past Medical History:  Diagnosis Date  . Arthritis   . Hyperlipidemia   . Impaired fasting glucose   . Kidney stone   . Obesity     Patient Active Problem List   Diagnosis Date Noted  . Other and unspecified hyperlipidemia 11/06/2012  . Elevated transaminase level 11/06/2012  . Obesity, unspecified 11/06/2012  . Insulin resistance 11/06/2012    Past Surgical History:  Procedure Laterality Date  . APPENDECTOMY    . cyst removal breast    . HERNIA REPAIR    . OTHER SURGICAL HISTORY Left    bone graft left ankle       Home Medications    Prior to Admission medications   Medication Sig Start Date End Date Taking? Authorizing Provider  ibuprofen (ADVIL,MOTRIN) 800 MG tablet Take 1 tablet (800 mg total) by mouth 3 (three) times daily. 02/14/14   Glynn Octave, MD  ondansetron (ZOFRAN) 4 MG tablet Take 1 tablet (4 mg total) by mouth every 6 (six) hours. 02/14/14   Glynn Octave, MD    oxyCODONE-acetaminophen (PERCOCET/ROXICET) 5-325 MG per tablet Take 2 tablets by mouth every 4 (four) hours as needed for severe pain. 02/14/14   Glynn Octave, MD  rosuvastatin (CRESTOR) 40 MG tablet Take 1/2 - 1 tab daily 11/06/12   Gillian Scarce, MD    Family History Family History  Problem Relation Age of Onset  . COPD Father   . Cancer Father 59    Prostate Cancer   . Hyperlipidemia Paternal Grandmother     Social History Social History  Substance Use Topics  . Smoking status: Never Smoker  . Smokeless tobacco: Current User    Types: Snuff  . Alcohol use Yes     Comment: occ     Allergies   Aspirin and Penicillins   Review of Systems Review of Systems  Musculoskeletal: Positive for back pain.  All other systems reviewed and are negative.    Physical Exam Updated Vital Signs BP 133/94 (BP Location: Right Arm)   Pulse 75   Temp 97.9 F (36.6 C) (Oral)   Resp 18   Ht  (1.778 m)   Wt 260 lb (117.9 kg)   SpO2 99%   BMI 37.31 kg/m   Physical Exam  Constitutional: He is oriented to person, place, and time.  He appears well-developed and well-nourished.  HENT:  Head: Normocephalic and atraumatic.  Cardiovascular: Normal rate and regular rhythm.   No murmur heard. Pulmonary/Chest: Effort normal and breath sounds normal. No respiratory distress.  Abdominal: Soft. There is no tenderness. There is no rebound and no guarding.  Musculoskeletal: He exhibits no edema.  Tender to palpation over the lower lumbar spine and sacral region. There is mild tenderness over the left SI joint. No CVA tenderness. Low back pain is reproducible with elevation of the right lower extremity  Neurological: He is alert and oriented to person, place, and time.  5 out of 5 strength in bilateral lower extremities with sensation to light touch intact in all 4 extremities.    Skin: Skin is warm and dry.  Psychiatric: He has a normal mood and affect. His behavior is normal.  Nursing  note and vitals reviewed.    ED Treatments / Results  Labs (all labs ordered are listed, but only abnormal results are displayed) Labs Reviewed  URINALYSIS, ROUTINE W REFLEX MICROSCOPIC (NOT [facility])    EKG  EKG Interpretation None       Radiology No results found.  Procedures Procedures (including critical care time)  Medications Ordered in ED Medications  ketorolac (TORADOL) injection 60 mg (not administered)  oxyCODONE-acetaminophen (PERCOCET/ROXICET) 5-325 MG per tablet 1 tablet (not administered)     Initial Impression / Assessment and Plan / ED Course  I have reviewed the triage vital signs and the nursing notes.  Pertinent labs & imaging results that were available during my care of the patient were reviewed by me and considered in my medical decision making (see chart for details).  Clinical Course    Pt with hx/o kidney stones here for evaluation of back pain.  He is neurovascularly intact with pain that is reproducible on examination. Presentation is not consistent with significant renal obstruction, cauda equina, epidural abscess. Will treat for musculoskeletal back pain. Discussed home care, outpatient follow-up, return precautions.  Final Clinical Impressions(s) / ED Diagnoses   Final diagnoses:  Acute low back pain    New Prescriptions New Prescriptions   No medications on file     Tilden FossaElizabeth Jance Siek, MD 03/14/16 1447

## 2016-03-14 NOTE — ED Notes (Signed)
Pt ambulatory to [facility] for CCU/A. Given strainer.

## 2016-03-20 ENCOUNTER — Telehealth: Payer: Self-pay | Admitting: *Deleted

## 2016-03-20 NOTE — Telephone Encounter (Signed)
Unable to reach patient at time of Pre-Visit Call.  Left message for patient to return call when available.    

## 2016-03-21 ENCOUNTER — Ambulatory Visit (INDEPENDENT_AMBULATORY_CARE_PROVIDER_SITE_OTHER): Payer: BLUE CROSS/BLUE SHIELD | Admitting: Family Medicine

## 2016-03-21 ENCOUNTER — Encounter: Payer: Self-pay | Admitting: Family Medicine

## 2016-03-21 VITALS — BP 150/100 | HR 74 | Temp 97.8°F | Ht 70.0 in | Wt 264.6 lb

## 2016-03-21 DIAGNOSIS — Z13220 Encounter for screening for lipoid disorders: Secondary | ICD-10-CM

## 2016-03-21 DIAGNOSIS — Z0000 Encounter for general adult medical examination without abnormal findings: Secondary | ICD-10-CM | POA: Diagnosis not present

## 2016-03-21 DIAGNOSIS — Z23 Encounter for immunization: Secondary | ICD-10-CM | POA: Diagnosis not present

## 2016-03-21 DIAGNOSIS — Z125 Encounter for screening for malignant neoplasm of prostate: Secondary | ICD-10-CM

## 2016-03-21 DIAGNOSIS — E669 Obesity, unspecified: Secondary | ICD-10-CM | POA: Diagnosis not present

## 2016-03-21 DIAGNOSIS — IMO0001 Reserved for inherently not codable concepts without codable children: Secondary | ICD-10-CM

## 2016-03-21 LAB — COMPLETE METABOLIC PANEL WITH GFR
ALBUMIN: 4.2 g/dL (ref 3.6–5.1)
ALK PHOS: 97 U/L (ref 40–115)
ALT: 34 U/L (ref 9–46)
AST: 22 U/L (ref 10–40)
BUN: 17 mg/dL (ref 7–25)
CALCIUM: 9.6 mg/dL (ref 8.6–10.3)
CO2: 25 mmol/L (ref 20–31)
Chloride: 105 mmol/L (ref 98–110)
Creat: 0.84 mg/dL (ref 0.60–1.35)
Glucose, Bld: 87 mg/dL (ref 65–99)
POTASSIUM: 4.1 mmol/L (ref 3.5–5.3)
Sodium: 139 mmol/L (ref 135–146)
Total Bilirubin: 0.7 mg/dL (ref 0.2–1.2)
Total Protein: 7 g/dL (ref 6.1–8.1)

## 2016-03-21 LAB — LIPID PANEL
CHOLESTEROL: 218 mg/dL — AB (ref 0–200)
HDL: 41.7 mg/dL (ref >39.00)
LDL Cholesterol: 144 mg/dL — ABNORMAL HIGH (ref 0–99)
NonHDL: 176.65
TRIGLYCERIDES: 164 mg/dL — AB (ref 0.0–149.0)
Total CHOL/HDL Ratio: 5
VLDL: 32.8 mg/dL (ref 0.0–40.0)

## 2016-03-21 LAB — HEMOGLOBIN A1C: Hgb A1c MFr Bld: 5.5 % (ref 4.6–6.5)

## 2016-03-21 LAB — PSA: PSA: 0.87 ng/mL (ref 0.10–4.00)

## 2016-03-21 NOTE — Patient Instructions (Addendum)

## 2016-03-21 NOTE — Progress Notes (Signed)
Chief Complaint  Patient presents with  . Establish Care    request for CPE       New Patient Visit SUBJECTIVE: HPI: Ronald Hansen is an 45 y.o.male who is being seen for establishing care.  The patient was previously seen yrs ago at this office with another provider. She left for private practice and he did not follow her.   Health maintenance history: Complete physical: >1 yr ago Routine blood work: >1 yr ago Tetanus: Unsure, likely >10 years ago  Concerns: Mother had colon cancer (34 yo)and father had prostate cancer (75 yo).  Exercise: Sedentary Diet: Could be better Smoking: No Seat belt: Yes  Review of Systems: Constitutional:  no unexpected change in weight, no fevers or chills Eye:  no recent significant change in vision Ear/Nose/Mouth/Throat:  Ears:  no tinnitus or hearing loss Nose/Mouth/Throat:  no complaints of nasal congestion or bleeding, no sore throat and oral sores Cardiovascular:  no chest pain, no palpitations Respiratory:  no cough and no shortness of breath Gastrointestinal:  no abdominal pain, no change in bowel habits, no nausea, vomiting, diarrhea, or constipation and no black or bloody stool GU:  Male: negative for dysuria, frequency, and incontinence and negative for prostate symptoms Musculoskeletal/Extremities:  no pain, redness, or swelling of the joints Integumentary (Skin/Breast):  no abnormal skin lesions reported- does have mole on left lateral ankle that has grown slightly over past 15 years Neurologic:  no headaches, no numbness, tingling Endocrine:  weight changes, masses in the neck, heat/cold intolerance, bowel or skin changes, or cardiovascular system symptoms Hematologic/Lymphatic:  no abnormal bleeding, no HIV risk factors, no night sweats, no swollen nodes, no weight loss   Allergies  Allergen Reactions  . Aspirin Other (See Comments)    Tinnitus   . Penicillins Nausea And Vomiting    Abdominal bloating   Past Medical  History:  Diagnosis Date  . Allergy   . Arthritis   . History of chicken pox   . History of hay fever   . History of kidney stones   . Hyperlipidemia   . Impaired fasting glucose   . Kidney stone   . Obesity    Past Surgical History:  Procedure Laterality Date  . APPENDECTOMY    . cyst removal breast    . FOOT SURGERY    . HERNIA REPAIR    . OTHER SURGICAL HISTORY Left    bone graft left ankle  . WISDOM TOOTH EXTRACTION     Social History   Social History  . Marital status: Married   Social History Main Topics  . Smoking status: Never Smoker  . Smokeless tobacco: Current User    Types: Snuff  . Alcohol use Yes     Comment: occ  . Drug use: No  . Sexual activity: Yes    Partners: Female   Social History Narrative   Marital Status: Married Chartered certified accountant)    Children:     Pets:   Living Situation: Lives with spouse    Occupation: Geologist, engineering Dealer)    Education: Engineer, agricultural    Tobacco Use/Exposure:  None    Alcohol Use:  Occasional   Drug Use:  None   Diet:  Regular   Exercise:  None   Hobbies:         Family History  Problem Relation Age of Onset  . COPD Father   . Cancer Father 69    Prostate Cancer   . Stroke Father   .  Hypertension Father   . Hyperlipidemia Paternal Grandmother   . Arthritis Paternal Grandmother   . Hypertension Paternal Grandmother   . Arthritis Mother   . Cancer Mother     Colon   . Hyperlipidemia Mother   . Hypertension Mother      Current Outpatient Prescriptions:  .  albuterol (PROVENTIL HFA;VENTOLIN HFA) 108 (90 Base) MCG/ACT inhaler, Inhale into the lungs. Inhale 1-2 puffs every six hours as needed for wheezing., Disp: , Rfl:  .  naproxen (NAPROSYN) 500 MG tablet, Take 1 tablet (500 mg total) by mouth 2 (two) times daily., Disp: 14 tablet, Rfl: 0 .  oxyCODONE-acetaminophen (PERCOCET/ROXICET) 5-325 MG tablet, Take 1 tablet by mouth every 6 (six) hours as needed for severe pain., Disp: 8 tablet, Rfl: 0 .   tiZANidine (ZANAFLEX) 4 MG tablet, Take 1 tablet (4 mg total) by mouth every 6 (six) hours as needed for muscle spasms., Disp: 8 tablet, Rfl: 0    OBJECTIVE: BP (!) 150/100 (BP Location: Right Arm, Patient Position: Sitting, Cuff Size: Large)   Pulse 74   Temp 97.8 F (36.6 C) (Oral)   Ht 5\' 10"  (1.778 m)   Wt 264 lb 9.6 oz (120 kg)   SpO2 96%   BMI 37.97 kg/m   Constitutional: -  VS reviewed -  Well developed, well nourished, appears stated age -  No apparent distress  Psychiatric: -  Oriented to person, place, and time -  Memory intact -  Affect and mood normal -  Fluent conversation, good eye contact -  Judgment and insight age appropriate  Eye: -  Conjunctivae clear, no discharge -  Pupils symmetric, round, reactive to light  ENMT: -  Oral mucosa without lesions, tongue and uvula midline    Tonsils not enlarged, no erythema, no exudate, trachea midline    Pharynx moist, no lesions, no erythema -  Nares patent and there is no discharge -  Ear canals patent without erythema or D/C; TM's shiny and clear b/l without evidence of infection  Neck: -  No gross swelling, no palpable masses -  Thyroid midline, not enlarged, mobile, no palpable masses  Cardiovascular: -  RRR, no murmurs -  No LE edema -  No bruits  Respiratory: -  Normal respiratory effort, no accessory muscle use, no retraction -  Breath sounds equal, no wheezes, no ronchi, no crackles  Gastrointestinal: -  Bowel sounds normal -  No tenderness, no distention, no guarding, no masses  Neurological:  -  CN II - XII grossly intact -  Patellar DTR 2/4 b/l wo clonus -  Calcaneal DTR 1/4 b/l wo clonus -  Biceps DTR 2/4 b/l wo clonus -  Sensation grossly intact to light touch, equal bilaterally  GU: -  No external lesions -  Testicles present b/l without mass, TTP or deformity -  No inguinal hernia  Musculoskeletal: -  No clubbing, no cyanosis -  Gait normal -  5/5 strength throughout  Skin: -  7 mm uniformly  hyperpigmented, scaly and slightly elevated lesion on the lateral left ankle; there is no erythema, drainage, or TTP. -  No other significant lesions on inspection -  Warm and dry to palpation   ASSESSMENT/PLAN: Well adult  Obesity, unspecified - Plan: HgB A1c, COMPLETE METABOLIC PANEL WITH GFR  Screening cholesterol level - Plan: Lipid Profile  Screening for prostate cancer - Plan: PSA  Need for diphtheria-tetanus-pertussis (Tdap) vaccine, adult/adolescent - Plan: Tdap vaccine greater than or equal to 7yo  IM  Orders as above. Written instructions for healthy exercise provided in AVS. Discussed warning signs for malignancy with lower skin lesion such as border irreg, color or size changes. Patient should return 2 weeks for recheck HTN and lab review. The patient voiced understanding and agreement to the plan.   Jilda RocheNicholas Paul [place]

## 2016-03-21 NOTE — Progress Notes (Signed)
Pre visit review using our clinic review tool, if applicable. No additional management support is needed unless otherwise documented below in the visit note. 

## 2016-04-06 ENCOUNTER — Ambulatory Visit (INDEPENDENT_AMBULATORY_CARE_PROVIDER_SITE_OTHER): Payer: BLUE CROSS/BLUE SHIELD | Admitting: Family Medicine

## 2016-04-06 ENCOUNTER — Encounter: Payer: Self-pay | Admitting: Family Medicine

## 2016-04-06 VITALS — BP 128/88 | HR 66 | Temp 98.1°F | Ht 70.0 in | Wt 262.8 lb

## 2016-04-06 DIAGNOSIS — Z09 Encounter for follow-up examination after completed treatment for conditions other than malignant neoplasm: Secondary | ICD-10-CM

## 2016-04-06 DIAGNOSIS — E785 Hyperlipidemia, unspecified: Secondary | ICD-10-CM | POA: Diagnosis not present

## 2016-04-06 DIAGNOSIS — L988 Other specified disorders of the skin and subcutaneous tissue: Secondary | ICD-10-CM | POA: Diagnosis not present

## 2016-04-06 NOTE — Progress Notes (Signed)
Pre visit review using our clinic review tool, if applicable. No additional management support is needed unless otherwise documented below in the visit note. 

## 2016-04-06 NOTE — Patient Instructions (Signed)
For the area on the back of your head, let us know if it starts changing colors, if it starts growing, or if the borders become irregular (jagged, no longer smooth).  Keep up the good work with your weight loss.

## 2016-04-06 NOTE — Progress Notes (Signed)
Chief Complaint  Patient presents with  . Follow-up    on BP  and review labs  . Spot on the back of neck    noticed on Tues-does not itch    Subjective Ronald Hansen is a 45 y.o. male who presents for elevated BP follow up. He does not monitor home blood pressures. He is not currently on any medications for BP. He is starting to adhere to a low sodium and low fat diet. Current exercise: none   Skin Area on back of neck that comes nad goes. It starts out red and then turns a brown color. His wife noticed it 3 days ago.  Obesity Pt is eating cleaner and starting to exercise more. He has lost 2 lbs.   Past Medical History:  Diagnosis Date  . Allergy   . Arthritis   . History of chicken pox   . History of hay fever   . History of kidney stones   . Hyperlipidemia   . Impaired fasting glucose   . Kidney stone   . Obesity    Family History  Problem Relation Age of Onset  . COPD Father   . Cancer Father [id number]    Prostate Cancer   . Stroke Father   . Hypertension Father   . Hyperlipidemia Paternal Grandmother   . Arthritis Paternal Grandmother   . Hypertension Paternal Grandmother   . Arthritis Mother   . Cancer Mother     Colon   . Hyperlipidemia Mother   . Hypertension Mother     Medications Current Outpatient Prescriptions on File Prior to Visit  Medication Sig Dispense Refill  . albuterol (PROVENTIL HFA;VENTOLIN HFA) 108 (90 Base) MCG/ACT inhaler Inhale into the lungs. Inhale 1-2 puffs every six hours as needed for wheezing.    . naproxen (NAPROSYN) 500 MG tablet Take 1 tablet (500 mg total) by mouth 2 (two) times daily. 14 tablet 0   Allergies Allergies  Allergen Reactions  . Aspirin Other (See Comments)    Tinnitus   . Penicillins Nausea And Vomiting    Abdominal bloating    Review of Systems Eye:  no recent significant change in vision Cardiovascular:  no exercise intolerance, no chest pain, no palpitations Respiratory:  no cough or shortness  of breath Neurologic:  no chronic headaches, numbness or tingling  Exam BP 128/88 (BP Location: Left Arm, Patient Position: Sitting, Cuff Size: Large)   Pulse 66   Temp 98.1 F (36.7 C) (Oral)   Ht 5\' 10"  (1.778 m)   Wt 262 lb 12.8 oz (119.2 kg)   SpO2 97%   BMI 37.71 kg/m  General:  well developed, well nourished, in no apparent distress Skin:  warm, no pallor or diaphoresis; there is a 2 cm circular and well-circumsrcibed lesion on the nape of his neck centrally located at approximately C3-4. It is not scaly and is uniformly hyperpigmented. No TTP. Eyes:  pupils equal and round, sclera anicteric without injection Neck: neck supple without adenopathy, thyromegaly, masses, or bruits  Lungs:  clear to auscultation, breath sounds equal bilaterally Cardio:  regular rate and rhythm without murmurs, heart sounds without clicks or rubs Extremities:  no clubbing, cyanosis, or edema, no deformities, no skin discoloration Psych: well oriented with normal range of affect and appropriate judgment/insight  Resolved condition, follow-up  Hyperlipidemia  Skin macule  BP good today in setting of positive lifestyle changes. Will hold off on recommending medication for both elevated LDL and for the one time  elevated BP reading. Skin lesion appeared to be most like a fungal infection, offered to trial a course of ketoconazole (cover for tinea versicolor or less likely tinea corporis). Opted to keep an eye on it and let us know if things change. Written instructions given for what to tell his wife to look for that would be a warning sign to let our office know.  F/u in 3 mo to see how he is doing with his weight loss and BP. The patient voiced understanding and agreement to the plan.  Ronald Hansen

## 2016-07-06 ENCOUNTER — Ambulatory Visit: Payer: BLUE CROSS/BLUE SHIELD | Admitting: Family Medicine

## 2016-07-06 ENCOUNTER — Telehealth: Payer: Self-pay | Admitting: Family Medicine

## 2016-07-06 NOTE — Telephone Encounter (Signed)
Patient lvm cancelling 7am appointment today due to work conflict [facility] 9am, charge or no charge

## 2016-07-06 NOTE — Telephone Encounter (Signed)
No charge. 

## 2016-07-11 ENCOUNTER — Encounter: Payer: Self-pay | Admitting: Family Medicine

## 2016-07-11 ENCOUNTER — Ambulatory Visit (INDEPENDENT_AMBULATORY_CARE_PROVIDER_SITE_OTHER): Payer: BLUE CROSS/BLUE SHIELD | Admitting: Family Medicine

## 2016-07-11 VITALS — BP 122/88 | HR 84 | Temp 98.3°F | Ht 69.0 in | Wt 265.0 lb

## 2016-07-11 DIAGNOSIS — E6609 Other obesity due to excess calories: Secondary | ICD-10-CM

## 2016-07-11 DIAGNOSIS — Z720 Tobacco use: Secondary | ICD-10-CM | POA: Diagnosis not present

## 2016-07-11 DIAGNOSIS — E781 Pure hyperglyceridemia: Secondary | ICD-10-CM | POA: Diagnosis not present

## 2016-07-11 DIAGNOSIS — Z6839 Body mass index (BMI) 39.0-39.9, adult: Secondary | ICD-10-CM

## 2016-07-11 NOTE — Progress Notes (Signed)
Pre visit review using our clinic review tool, if applicable. No additional management support is needed unless otherwise documented below in the visit note. 

## 2016-07-11 NOTE — Patient Instructions (Signed)
Stop chewing.

## 2016-07-11 NOTE — Progress Notes (Signed)
Chief Complaint  Patient presents with  . Follow-up    Subjective: Patient is a 45 y.o. male here for obesity and hypertriglyceridemia.  Pt has been exercising routinely around twice weekly. He used to go around 5-6 times per week and was losing weight. He has cut lots of red meat out of diet. He is improving things with processed foods and sodas, though he still drinks around 1 per day. His wife is also becoming more active and working to live healthier.  He continues to chew tobacco. He appears to be in the pre contemplative phase.   ROS: Heart: Denies chest pain  Family History  Problem Relation Age of Onset  . COPD Father   . Cancer Father [id number]    Prostate Cancer   . Stroke Father   . Hypertension Father   . Hyperlipidemia Paternal Grandmother   . Arthritis Paternal Grandmother   . Hypertension Paternal Grandmother   . Arthritis Mother   . Cancer Mother     Colon   . Hyperlipidemia Mother   . Hypertension Mother    Past Medical History:  Diagnosis Date  . Allergy   . Arthritis   . History of chicken pox   . History of hay fever   . History of kidney stones   . Hyperlipidemia   . Impaired fasting glucose   . Kidney stone   . Obesity    Allergies  Allergen Reactions  . Aspirin Other (See Comments)    Tinnitus   . Penicillins Nausea And Vomiting    Abdominal bloating    Current Outpatient Prescriptions:  .  albuterol (PROVENTIL HFA;VENTOLIN HFA) 108 (90 Base) MCG/ACT inhaler, Inhale into the lungs. Inhale 1-2 puffs every six hours as needed for wheezing., Disp: , Rfl:   Objective: BP 122/88 (BP Location: Left Arm, Patient Position: Sitting, Cuff Size: Large)   Pulse 84   Temp 98.3 F (36.8 C) (Oral)   Ht 5\' 9"  (1.753 m)   Wt 265 lb (120.2 kg)   SpO2 94%   BMI 39.13 kg/m  General: Awake, appears stated age HEENT: MMM, EOMi Heart: RRR, no murmurs, no LE edema, no bruits Lungs: CTAB, no rales, wheezes or rhonchi. No accessory muscle use Psych: Age  appropriate judgment and insight, normal affect and mood  Assessment and Plan: Class 2 obesity due to excess calories without serious comorbidity with body mass index (BMI) of 39.0 to 39.9 in adult - Plan: Amb ref to Medical Nutrition Therapy-MNT  Hypertriglyceridemia  Tobacco abuse  Orders as above. Recommended he stop chewing.  F/u in 9 mo unless he needs me. The patient voiced understanding and agreement to the plan.  Jilda Rocheicholas Paul [place], DO 07/11/16  11:52 AM

## 2016-09-24 ENCOUNTER — Other Ambulatory Visit (HOSPITAL_COMMUNITY)
Admission: RE | Admit: 2016-09-24 | Discharge: 2016-09-24 | Disposition: A | Discharge status: Home / Self Care | Payer: BLUE CROSS/BLUE SHIELD | Source: Ambulatory Visit | Attending: Family Medicine | Admitting: Family Medicine

## 2016-09-24 ENCOUNTER — Encounter: Payer: Self-pay | Admitting: Family Medicine

## 2016-09-24 ENCOUNTER — Ambulatory Visit (INDEPENDENT_AMBULATORY_CARE_PROVIDER_SITE_OTHER): Payer: BLUE CROSS/BLUE SHIELD | Admitting: Family Medicine

## 2016-09-24 VITALS — BP 114/104 | HR 88 | Temp 98.4°F | Ht 69.0 in | Wt 256.8 lb

## 2016-09-24 DIAGNOSIS — R05 Cough: Secondary | ICD-10-CM

## 2016-09-24 DIAGNOSIS — R058 Other specified cough: Secondary | ICD-10-CM

## 2016-09-24 DIAGNOSIS — Z113 Encounter for screening for infections with a predominantly sexual mode of transmission: Secondary | ICD-10-CM

## 2016-09-24 DIAGNOSIS — R0683 Snoring: Secondary | ICD-10-CM

## 2016-09-24 DIAGNOSIS — Z114 Encounter for screening for human immunodeficiency virus [HIV]: Secondary | ICD-10-CM | POA: Diagnosis not present

## 2016-09-24 DIAGNOSIS — R030 Elevated blood-pressure reading, without diagnosis of hypertension: Secondary | ICD-10-CM

## 2016-09-24 LAB — HIV ANTIBODY (ROUTINE TESTING W REFLEX): HIV 1&2 Ab, 4th Generation: NONREACTIVE

## 2016-09-24 MED ORDER — BENZONATATE 100 MG PO CAPS: 100.0000 mg | ORAL_CAPSULE | Freq: Three times a day (TID) | ORAL | 0 refills | Status: DC | PRN

## 2016-09-24 NOTE — Progress Notes (Signed)
Chief Complaint  Patient presents with  . Cough    Pt reports having flu 09/09/16 was treated,Pt reports tickle in throat with hot flashes and clammy that comes and goes     Ronald Hansen here for a cough.  The patient was treated for the flu in early February. He reports a dry cough caused by tickle in his throat. It has no relation to meals or position. He does not have a history of reflux. He will intermittently have waves of feeling warm and sweats. They're not as bad as the sweats he had when he had the flu. This will happen during the day and at night. It is not related to exertion or anxious thoughts. They're not soaking through his close or sheets. He denies any fevers, muscle aches, shortness of breath, shaking, nasal congestion, runny nose, sinus pain, ear pain, or itchy/watery eyes.  He also brought up that his wife states his snoring is been worsening usual. She is concerned he may have sleep apnea. He has never been evaluated for this before.  He is also having marital strife. He denies any extramarital sexual encounters. He is requesting that he be tested for STD's per her desire. Pt is not having any pain with urination, discharge, skin lesions, or testicular pain.   ROS:  Const: Denies fevers HEENT: As noted in HPI Lungs: No SOB  Past Medical History:  Diagnosis Date  . Allergy   . Arthritis   . History of chicken pox   . History of hay fever   . History of kidney stones   . Hyperlipidemia   . Impaired fasting glucose   . Kidney stone   . Obesity    Family History  Problem Relation Age of Onset  . COPD Father   . Cancer Father 63    Prostate Cancer   . Stroke Father   . Hypertension Father   . Hyperlipidemia Paternal Grandmother   . Arthritis Paternal Grandmother   . Hypertension Paternal Grandmother   . Arthritis Mother   . Cancer Mother     Colon   . Hyperlipidemia Mother   . Hypertension Mother     BP (!) 114/104 (BP Location: Left Arm, Patient  Position: Sitting, Cuff Size: Large)   Pulse 88   Temp 98.4 F (36.9 C) (Oral)   Ht 5\' 9"  (1.753 m)   Wt 256 lb 12.8 oz (116.5 kg)   SpO2 98%   BMI 37.92 kg/m  General: Awake, alert, appears stated age HEENT: AT, [place], ears patent b/l and TM's neg, nares patent w/o discharge, no sinus tenderness, pharynx pink and without exudates, MMM Neck: No masses or asymmetry Heart: RRR, no murmurs, no bruits Lungs: CTAB, no accessory muscle use Psych: Age appropriate judgment and insight, normal mood and affect  Snoring - Plan: Ambulatory referral to Pulmonology  Post-viral cough syndrome - Plan: benzonatate (TESSALON) 100 MG capsule  Elevated blood pressure reading  Encounter for screening for HIV - Plan: HIV antibody  Screen for STD (sexually transmitted disease) - Plan: Urine cytology ancillary only  Orders as above. Will refer to sleep for possible OSA. I think that this could have been worsened by his recent illness, causing both his increase in BP and sweating (due to higher amounts of epinephrine in system). Given lack of fevers and lower amounts of sweating, I do not believe this is infectious. Brought up reflux as also being a cause, but does not seem like the case.  Discussed  post infectious cough and how it can last 4-6 weeks. Urgent care actually told him this as well. Continue to push fluids, practice good hand hygiene, cover mouth when coughing. Screening as above for STI's. Get home BP monitor and recheck in 2 weeks for nurse visit. F/u prn otherwise. If starting to experience fevers, shaking, or shortness of breath, seek immediate care. Pt voiced understanding and agreement to the plan.  Jilda Rocheicholas Paul [place], DO 09/24/16 8:40 AM

## 2016-09-24 NOTE — Patient Instructions (Signed)
Around 3 times per week, check your blood pressure 4 times per day. Twice in the morning and twice in the evening. The readings should be at least one minute apart. Write down these values and bring them to your next nurse visit/appointment.  When you check your BP, make sure you have been doing something calm/relaxing 5 minutes prior to checking. Both feet should be flat on the floor and you should be sitting. Use your left arm and make sure it is in a relaxed position (on a table), and that the cuff is at the approximate level/height of your heart.  If you don't hear anything about your sleep specialist referral in the next 1-2 weeks, give our office a call asking for an update.  If you start having drenching night sweats, shortness of breath, or fevers, let us know or seek immediate care.

## 2016-09-24 NOTE — Progress Notes (Signed)
Pre visit review using our clinic review tool, if applicable. No additional management support is needed unless otherwise documented below in the visit note. 

## 2016-09-25 LAB — URINE CYTOLOGY ANCILLARY ONLY
Chlamydia: NEGATIVE
Neisseria Gonorrhea: NEGATIVE

## 2016-10-12 ENCOUNTER — Ambulatory Visit (INDEPENDENT_AMBULATORY_CARE_PROVIDER_SITE_OTHER): Payer: BLUE CROSS/BLUE SHIELD | Admitting: Family Medicine

## 2016-10-12 VITALS — BP 147/94 | HR 71

## 2016-10-12 DIAGNOSIS — R030 Elevated blood-pressure reading, without diagnosis of hypertension: Secondary | ICD-10-CM | POA: Diagnosis not present

## 2016-10-12 NOTE — Progress Notes (Signed)
Agree with above. Has been doing excellent with weight loss. Offer medication, but continue with lifestyle change and continue to monitor BP. See me in 1 mo.

## 2016-10-12 NOTE — Progress Notes (Signed)
Pre visit review using our clinic review tool, if applicable. No additional management support is needed unless otherwise documented below in the visit note.  Patient came in clinic for blood pressure check per OV note 09/24/16. Currently, he does not take any medications for blood pressure. Patient reported that recently he's made changes to his diet and reduced caffeine intake. Also, he brought in a daily log of his blood pressure readings at home. In today's visit, the readings were: BP 134/94 P 78 & BP 147/94 P 71.  Per Dr. Carmelia RollerWendling: Continue lifestyle modifications, diet changes and exercise. Another option for home blood pressure readings are to check 2 readings in the morning; 1 minute apart and 2 in the evenings; 1 minute apart. Return in 4 weeks for an office visit with PCP. Please bring daily log to the next appointment.  Patient was made aware of the provider's recommendations and verbalized understanding. Next appointment scheduled for 11/07/16 at 7:00 AM.

## 2016-10-12 NOTE — Patient Instructions (Addendum)
Per Dr. Carmelia RollerWendling: Continue lifestyle modifications, diet changes and exercise. Another option for home blood pressure readings are to check 2 readings in the morning; 1 minute apart and 2 in the evenings; 1 minute apart. Return in 4 weeks for an office visit with PCP. Please bring daily log to the next appointment.

## 2016-11-06 ENCOUNTER — Ambulatory Visit (INDEPENDENT_AMBULATORY_CARE_PROVIDER_SITE_OTHER): Payer: BLUE CROSS/BLUE SHIELD | Admitting: Pulmonary Disease

## 2016-11-06 ENCOUNTER — Encounter: Payer: Self-pay | Admitting: Pulmonary Disease

## 2016-11-06 DIAGNOSIS — G4710 Hypersomnia, unspecified: Secondary | ICD-10-CM

## 2016-11-06 NOTE — Patient Instructions (Signed)

## 2016-11-06 NOTE — Progress Notes (Signed)
Subjective:    Patient ID: Ronald Hansen, male    DOB: January 15, 1971, 46 y.o.   MRN: [id number]  HPI   This is the case of Ronald Hansen, 46 y.o. Male, who was referred by Dr. Arva Chafe in consultation regarding OSA.   As you very well know, patient is a nonsmoker, not known to have asthma or copd. Patient has snoring, witnessed apneas, gasping, choking. Sleeps 7-8 hrs/night.  Has issues falling and staying asleep. Feels unrefreshed in am.  Very tired and sleepy.  Has hypersomnia. Lives in [place]. Can get sleepy driving in pm. (-) abnormal behavior in sleep.   He works at [employer]    ESS 13.    Review of Systems  Constitutional: Positive for unexpected weight change. Negative for fever.  HENT: Negative.  Negative for congestion, dental problem, ear pain, nosebleeds, postnasal drip, rhinorrhea, sinus pressure, sneezing, sore throat and trouble swallowing.   Eyes: Negative.  Negative for redness and itching.  Respiratory: Positive for cough. Negative for chest tightness, shortness of breath and wheezing.   Cardiovascular: Negative.  Negative for palpitations and leg swelling.  Gastrointestinal: Negative.  Negative for nausea and vomiting.  Endocrine: Negative.   Genitourinary: Negative.  Negative for dysuria.  Musculoskeletal: Positive for joint swelling.  Skin: Negative.  Negative for rash.  Allergic/Immunologic: Negative.  Negative for environmental allergies, food allergies and immunocompromised state.  Neurological: Positive for headaches.  Hematological: Negative.  Does not bruise/bleed easily.  Psychiatric/Behavioral: Negative.  Negative for dysphoric mood. The patient is not nervous/anxious.    Past Medical History:  Diagnosis Date  . Allergy   . Arthritis   . History of chicken pox   . History of hay fever   . History of kidney stones   . Hyperlipidemia   . Impaired fasting glucose   . Kidney stone   . Obesity    (-) CA,  DVT  Family History  Problem Relation Age of Onset  . COPD Father   . Cancer Father 66    Prostate Cancer   . Stroke Father   . Hypertension Father   . Hyperlipidemia Paternal Grandmother   . Arthritis Paternal Grandmother   . Hypertension Paternal Grandmother   . Arthritis Mother   . Cancer Mother     Colon   . Hyperlipidemia Mother   . Hypertension Mother      Past Surgical History:  Procedure Laterality Date  . APPENDECTOMY    . cyst removal breast    . FOOT SURGERY    . HERNIA REPAIR    . OTHER SURGICAL HISTORY Left    bone graft left ankle  . WISDOM TOOTH EXTRACTION      Social History   Social History  . Marital status: Married    Spouse name: N/A  . Number of children: N/A  . Years of education: N/A   Occupational History  . Not on file.   Social History Main Topics  . Smoking status: Never Smoker  . Smokeless tobacco: Current User    Types: Snuff  . Alcohol use Yes     Comment: occ  . Drug use: No  . Sexual activity: Yes    Partners: Female   Other Topics Concern  . Not on file   Social History Narrative   Marital Status: Married Chartered certified accountant)    Children:     Pets:   Living Situation: Lives with spouse    Occupation: Charity fundraiser)  Education: Engineer, agricultural    Tobacco Use/Exposure:  None    Alcohol Use:  Occasional   Drug Use:  None   Diet:  Regular   Exercise:  None   Hobbies:           Allergies  Allergen Reactions  . Aspirin Other (See Comments)    Tinnitus   . Penicillins Nausea And Vomiting    Abdominal bloating  . Latex Rash     Outpatient Medications Prior to Visit  Medication Sig Dispense Refill  . albuterol (PROVENTIL HFA;VENTOLIN HFA) 108 (90 Base) MCG/ACT inhaler Inhale into the lungs. Inhale 1-2 puffs every six hours as needed for wheezing.    . chlorpheniramine-HYDROcodone (TUSSIONEX) 10-8 MG/5ML SUER Take 5 mLs by mouth as needed.    . clotrimazole (MYCELEX) 10 MG troche Take 10 mg by mouth  daily.  0  . benzonatate (TESSALON) 100 MG capsule Take 1 capsule (100 mg total) by mouth 3 (three) times daily as needed for cough. (Patient not taking: Reported on 11/06/2016) 20 capsule 0   No facility-administered medications prior to visit.    No orders of the defined types were placed in this encounter.       Objective:   Physical Exam   Vitals:  Vitals:   11/06/16 1444  BP: 124/62  Pulse: 87  SpO2: 96%  Weight: 251 lb 3.2 oz (113.9 kg)  Height:  (1.753 m)    Constitutional/General:  Pleasant, well-nourished, well-developed, not in any distress,  Comfortably seating.  Well kempt  Body mass index is 37.1 kg/m. Wt Readings from Last 3 Encounters:  11/06/16 251 lb 3.2 oz (113.9 kg)  09/24/16 256 lb 12.8 oz (116.5 kg)  07/11/16 265 lb (120.2 kg)    HEENT: Pupils equal and reactive to light and accommodation. Anicteric sclerae. Normal nasal mucosa.   No oral  lesions,  mouth clear,  oropharynx clear, no postnasal drip. (-) Oral thrush. No dental caries.  Airway - Mallampati class III  Neck: No masses. Midline trachea. No JVD, (-) LAD. (-) bruits appreciated.  Respiratory/Chest: Grossly normal chest. (-) deformity. (-) Accessory muscle use.  Symmetric expansion. (-) Tenderness on palpation.  Resonant on percussion.  Diminished BS on both lower lung zones. (-) wheezing, crackles, rhonchi (-) egophony  Cardiovascular: Regular rate and  rhythm, heart sounds normal, no murmur or gallops, no peripheral edema  Gastrointestinal:  Normal bowel sounds. Soft, non-tender. No hepatosplenomegaly.  (-) masses.   Musculoskeletal:  Normal muscle tone. Normal gait.   Extremities: Grossly normal. (-) clubbing, cyanosis.  (-) edema  Skin: (-) rash,lesions seen.   Neurological/Psychiatric : alert, oriented to time, place, person. Normal mood and affect         Assessment & Plan:  Hypersomnia Patient has snoring, witnessed apneas, gasping, choking. Sleeps 7-8  hrs/night.  Has issues falling and staying asleep. Feels unrefreshed in am.  Very tired and sleepy.  Has hypersomnia. Lives in [place]. Can get sleepy driving in pm. (-) abnormal behavior in sleep.   He works at [employer]   ESS 13.   Plan :  We discussed about the diagnosis of Obstructive Sleep Apnea (OSA) and implications of untreated OSA. We discussed about CPAP and BiPaP as possible treatment options.    We will schedule the patient for a sleep study. Plan for a home study. Anticipate, he will be moderate-severe. Anticipate no issues with CPAP. May need a full face mask because of his beard.  If  home study is negative, he is okay with doing a lab study.   Patient was instructed to call the office if he/she has not heard back from the office 1-2 weeks after the sleep study.   Patient was instructed to call the office if he/she is having issues with the PAP device.   We discussed good sleep hygiene.   Patient was advised not to engage in activities requiring concentration and/or vigilance if he/she is sleepy.  Patient was advised not to drive if he/she is sleepy.      Thank you very much for letting me participate in this patient's care. Please do not hesitate to give me a call if you have any questions or concerns regarding the treatment plan.   Patient will follow up with me in 6-8 weeks.     Pollie Meyer, MD 11/06/2016   3:34 PM Pulmonary and Critical Care Medicine [facility] Pager: [phone] Office: [phone], Fax: [phone]

## 2016-11-06 NOTE — Assessment & Plan Note (Signed)
Patient has snoring, witnessed apneas, gasping, choking. Sleeps 7-8 hrs/night.  Has issues falling and staying asleep. Feels unrefreshed in am.  Very tired and sleepy.  Has hypersomnia. Lives in [place]. Can get sleepy driving in pm. (-) abnormal behavior in sleep.   He works at [employer]   ESS 13.   Plan :  We discussed about the diagnosis of Obstructive Sleep Apnea (OSA) and implications of untreated OSA. We discussed about CPAP and BiPaP as possible treatment options.    We will schedule the patient for a sleep study. Plan for a home study. Anticipate, he will be moderate-severe. Anticipate no issues with CPAP. May need a full face mask because of his beard.  If home study is negative, he is okay with doing a lab study.   Patient was instructed to call the office if he/she has not heard back from the office 1-2 weeks after the sleep study.   Patient was instructed to call the office if he/she is having issues with the PAP device.   We discussed good sleep hygiene.   Patient was advised not to engage in activities requiring concentration and/or vigilance if he/she is sleepy.  Patient was advised not to drive if he/she is sleepy.

## 2016-11-07 ENCOUNTER — Encounter: Payer: Self-pay | Admitting: Family Medicine

## 2016-11-07 ENCOUNTER — Ambulatory Visit (INDEPENDENT_AMBULATORY_CARE_PROVIDER_SITE_OTHER): Payer: BLUE CROSS/BLUE SHIELD | Admitting: Family Medicine

## 2016-11-07 VITALS — BP 122/88 | HR 67 | Temp 97.7°F | Resp 16 | Ht 70.0 in | Wt 253.6 lb

## 2016-11-07 DIAGNOSIS — Z09 Encounter for follow-up examination after completed treatment for conditions other than malignant neoplasm: Secondary | ICD-10-CM

## 2016-11-07 DIAGNOSIS — N528 Other male erectile dysfunction: Secondary | ICD-10-CM | POA: Diagnosis not present

## 2016-11-07 NOTE — Progress Notes (Signed)
Pre visit review using our clinic review tool, if applicable. No additional management support is needed unless otherwise documented below in the visit note. 

## 2016-11-07 NOTE — Patient Instructions (Addendum)
OK to try keto diet. There are apps to help with this.   Meal prepping with [employer] may make weekday meals easier.  Contact [phone] to schedule an appointment or inquire about cost/insurance coverage. Ask about sexual counseling.

## 2016-11-07 NOTE — Progress Notes (Signed)
Chief Complaint  Patient presents with  . Hypertension    follow up    Subjective Ronald Hansen is a 46 y.o. male who presents for elevated BP follow up. He does monitor home blood pressures. Blood pressures ranging from 120-130's/70-80's on average. He is Not currently taking any medicine. He is adhering to a healthy diet overall. Current exercise: walking  He also brought up erectile dysfunction issues. Over the past 4-6 months, when he becomes intimate with his wife, he is able to attain an erection, however not able to maintain it. He did have some marital strife over the past several months that he says is improving. He does follow the counselor for this. He does not watch Internet pornography routinely.   Past Medical History:  Diagnosis Date  . Allergy   . Arthritis   . History of chicken pox   . History of hay fever   . History of kidney stones   . Hyperlipidemia   . Impaired fasting glucose   . Kidney stone   . Obesity    Family History  Problem Relation Age of Onset  . COPD Father   . Cancer Father 60    Prostate Cancer   . Stroke Father   . Hypertension Father   . Hyperlipidemia Paternal Grandmother   . Arthritis Paternal Grandmother   . Hypertension Paternal Grandmother   . Arthritis Mother   . Cancer Mother     Colon   . Hyperlipidemia Mother   . Hypertension Mother      Medications Current Outpatient Prescriptions on File Prior to Visit  Medication Sig Dispense Refill  . chlorpheniramine-HYDROcodone (TUSSIONEX) 10-8 MG/5ML SUER Take 5 mLs by mouth as needed.    Marland Kitchen albuterol (PROVENTIL HFA;VENTOLIN HFA) 108 (90 Base) MCG/ACT inhaler Inhale into the lungs. Inhale 1-2 puffs every six hours as needed for wheezing.     Allergies Allergies  Allergen Reactions  . Aspirin Other (See Comments)    Tinnitus   . Penicillins Nausea And Vomiting    Abdominal bloating  . Latex Rash    Review of Systems Cardiovascular: no chest pain Respiratory:   no shortness of breath  Exam BP 122/88 (BP Location: Left Arm, Patient Position: Sitting, Cuff Size: Normal)   Pulse 67   Temp 97.7 F (36.5 C) (Oral)   Resp 16   Ht  (1.778 m)   Wt 253 lb 9.6 oz (115 kg)   SpO2 97%   BMI 36.39 kg/m  General:  well developed, well nourished, in no apparent distress Lungs:  no accessory muscle use Psych: well oriented with normal range of affect and appropriate judgment/insight  Follow-up for resolved condition  Other male erectile dysfunction  Home blood pressure readings are reassuring. He is also undergoing evaluation for possible sleep apnea. Counseled on diet and exercise, he continues to do well. Can scale back on home blood pressure monitoring. Gave number to call and ask about sexual counseling. I feel this is psychogenic in nature. If he continues to have issues despite making changes, can discuss getting lab work and also trialing a phosphodiesterase inhibitor. My fear for him having priapism is high though given that he is able to have erections. F/u prn or as originally scheduled for CPE in Sept. The patient voiced understanding and agreement to the plan.  Greater than 15 minutes were spent face to face with the patient with greater than 50% of this time spent counseling on healthy eating, specific questions on  the ketogenic diet, exercise, and erectile dysfunction.    Jilda Roche [place], DO 11/07/16  8:35 AM

## 2016-11-29 DIAGNOSIS — G4733 Obstructive sleep apnea (adult) (pediatric): Secondary | ICD-10-CM | POA: Diagnosis not present

## 2016-12-07 ENCOUNTER — Other Ambulatory Visit: Payer: Self-pay | Admitting: *Deleted

## 2016-12-07 ENCOUNTER — Telehealth: Payer: Self-pay | Admitting: Pulmonary Disease

## 2016-12-07 DIAGNOSIS — G4733 Obstructive sleep apnea (adult) (pediatric): Secondary | ICD-10-CM | POA: Diagnosis not present

## 2016-12-07 DIAGNOSIS — G4710 Hypersomnia, unspecified: Secondary | ICD-10-CM

## 2016-12-07 NOTE — Telephone Encounter (Signed)
    Please call the pt and tell the pt the HOME SLEEP STUDY  showed OSA   Pt stops breathing 13   times an hour.   Home sleep study was done on : 11/29/16  Please order autoCPAP 5-15 cm H2O. Patient will need a mask fitting session. Patient will need a 1 month download.   Patient needs to be seen by me or any of the NPs/APPs  4-6 weeks after obtaining the cpap machine. Let me know if you receive this.   Thanks!   J. Alexis FrockAngelo A de Dios, MD 12/07/2016, 5:12 AM

## 2016-12-10 NOTE — Telephone Encounter (Signed)
Spoke to pt about her sleep study per AD. Pt understood and agreed to the order being placed. The order was placed and they had no further questions. Nothing further is needed  Pt's follow up appt has been moved due to new cpap start.

## 2016-12-26 ENCOUNTER — Ambulatory Visit: Payer: BLUE CROSS/BLUE SHIELD | Admitting: Pulmonary Disease

## 2017-01-14 ENCOUNTER — Ambulatory Visit: Payer: BLUE CROSS/BLUE SHIELD | Admitting: Adult Health

## 2017-02-07 ENCOUNTER — Ambulatory Visit: Payer: BLUE CROSS/BLUE SHIELD | Admitting: Acute Care

## 2017-03-05 ENCOUNTER — Telehealth: Payer: Self-pay | Admitting: Acute Care

## 2017-03-05 NOTE — Telephone Encounter (Signed)
Pt has been scheduled with SG for 03/25/17 at 9:45. Pt is aware and voiced his understanding.  Nothing further needed.

## 2017-03-05 NOTE — Telephone Encounter (Signed)
Please call patient and let him know that he needs to be seen in the office for a 31-90 day follow-up after initiation of CPAP visit. Let them know that he is at risk of having his machine taken away if he doesn't do this. This be a shame as he is compliant and per the read out the CPAP therapy is benefiting him as his apnea events have significantly dropped. Please see for get in and by August 21. Thank you so much

## 2017-03-25 ENCOUNTER — Ambulatory Visit: Payer: BLUE CROSS/BLUE SHIELD | Admitting: Acute Care

## 2017-04-10 ENCOUNTER — Ambulatory Visit: Payer: BLUE CROSS/BLUE SHIELD | Admitting: Family Medicine

## 2017-04-10 DIAGNOSIS — Z0289 Encounter for other administrative examinations: Secondary | ICD-10-CM

## 2018-01-01 ENCOUNTER — Ambulatory Visit (INDEPENDENT_AMBULATORY_CARE_PROVIDER_SITE_OTHER): Payer: 59 | Admitting: Family Medicine

## 2018-01-01 ENCOUNTER — Encounter: Payer: Self-pay | Admitting: Family Medicine

## 2018-01-01 VITALS — BP 132/88 | HR 75 | Temp 98.4°F | Ht 70.0 in | Wt 253.0 lb

## 2018-01-01 DIAGNOSIS — L309 Dermatitis, unspecified: Secondary | ICD-10-CM

## 2018-01-01 MED ORDER — CETIRIZINE HCL 10 MG PO TABS: 10.0000 mg | ORAL_TABLET | Freq: Every day | ORAL | 5 refills | Status: DC

## 2018-01-01 MED ORDER — TRIAMCINOLONE ACETONIDE 0.5 % EX CREA: 1.0000 "application " | TOPICAL_CREAM | Freq: Two times a day (BID) | CUTANEOUS | 1 refills | Status: DC

## 2018-01-01 NOTE — Patient Instructions (Addendum)
Use Gold Bond, Eucerin or some other type of lotion at least twice daily.  Claritin (loratadine), Allegra (fexofenadine), Zyrtec (cetirizine); these are listed in order from weakest to strongest. Generic, and therefore cheaper, options are in the parentheses.   There are available OTC, and the generic versions, which may be cheaper, are in parentheses. Show this to a pharmacist if you have trouble finding any of these items.  Let me know if medication is too expensive.  Let us know if you need anything.

## 2018-01-01 NOTE — Progress Notes (Signed)
Pre visit review using our clinic review tool, if applicable. No additional management support is needed unless otherwise documented below in the visit note. 

## 2018-01-01 NOTE — Progress Notes (Signed)
Chief Complaint  Patient presents with  . Rash    Ronald Hansen is a 47 y.o. male here for a skin complaint.  Duration: 3 weeks Location: arms Pruritic? Yes Painful? No Drainage? No New soaps/lotions/topicals/detergents? No Sick contacts? No Other associated symptoms: None Therapies tried thus far: lotions, benadryl; have been helpful  ROS:  Const: No fevers Skin: As noted in HPI  Past Medical History:  Diagnosis Date  . Allergy   . Arthritis   . History of chicken pox   . History of hay fever   . History of kidney stones   . Hyperlipidemia   . Impaired fasting glucose   . Kidney stone   . Obesity    BP 132/88 (BP Location: Left Arm, Patient Position: Sitting, Cuff Size: Large)   Pulse 75   Temp 98.4 F (36.9 C) (Oral)   Ht  (1.778 m)   Wt 253 lb (114.8 kg)   SpO2 96%   BMI 36.30 kg/m  Gen: awake, alert, appearing stated age Lungs: No accessory muscle use Skin: see below. No excessive warmth Psych: Age appropriate judgment and insight              Dermatitis - Plan: cetirizine (ZYRTEC ALLERGY) 10 MG tablet, triamcinolone cream (KENALOG) 0.5 %  Orders as above.  Could be a photosensitive dermatitis vs exposure? Try not to itch, emollients, topical steroid, antihistamine.  F/u prn. The patient voiced understanding and agreement to the plan.  Jilda Roche [place], DO 01/01/18 12:10 PM

## 2018-01-07 ENCOUNTER — Other Ambulatory Visit: Payer: 59

## 2018-01-08 ENCOUNTER — Other Ambulatory Visit: Payer: 59

## 2018-01-08 ENCOUNTER — Other Ambulatory Visit: Payer: Self-pay | Admitting: Family Medicine

## 2018-01-08 DIAGNOSIS — Z0000 Encounter for general adult medical examination without abnormal findings: Secondary | ICD-10-CM

## 2018-01-08 NOTE — Progress Notes (Signed)
Pt needs a physical for his form and labs. Will order future as he planned to come in today.

## 2018-02-10 ENCOUNTER — Ambulatory Visit (INDEPENDENT_AMBULATORY_CARE_PROVIDER_SITE_OTHER): Payer: 59 | Admitting: Podiatry

## 2018-02-10 ENCOUNTER — Encounter: Payer: Self-pay | Admitting: Podiatry

## 2018-02-10 VITALS — BP 122/66 | HR 64 | Temp 98.6°F | Resp 16

## 2018-02-10 DIAGNOSIS — L600 Ingrowing nail: Secondary | ICD-10-CM

## 2018-02-10 DIAGNOSIS — B353 Tinea pedis: Secondary | ICD-10-CM | POA: Diagnosis not present

## 2018-02-10 NOTE — Patient Instructions (Signed)

## 2018-02-10 NOTE — Progress Notes (Signed)
   Subjective:    Patient ID: Rosezella RumpfChristopher E Poulter, male    DOB: 08/04/1971, 47 y.o.   MRN: [id number]  HPI    Review of Systems  All other systems reviewed and are negative.      Objective:   Physical Exam        Assessment & Plan:

## 2018-02-24 ENCOUNTER — Ambulatory Visit (INDEPENDENT_AMBULATORY_CARE_PROVIDER_SITE_OTHER): Payer: Self-pay | Admitting: Podiatry

## 2018-02-24 DIAGNOSIS — Z9889 Other specified postprocedural states: Secondary | ICD-10-CM

## 2018-02-24 NOTE — Progress Notes (Signed)
  Subjective:  Patient ID: Ronald Hansen, male    DOB: 01/24/1971,  MRN: [id number]  Chief Complaint  Patient presents with  . Nail Problem    R hallux medial border x 3 wks; tender to the touch -w/ white drainage, redness and swelling Tx: epsom salt soaking and red oil    47 y.o. male presents with and ingrown toenail to the right great toe. Past Medical History:  Diagnosis Date  . Allergy   . Arthritis   . History of chicken pox   . History of hay fever   . History of kidney stones   . Hyperlipidemia   . Impaired fasting glucose   . Kidney stone   . Obesity    Past Surgical History:  Procedure Laterality Date  . APPENDECTOMY    . cyst removal breast    . FOOT SURGERY    . HERNIA REPAIR    . OTHER SURGICAL HISTORY Left    bone graft left ankle  . WISDOM TOOTH EXTRACTION      Current Outpatient Medications:  .  cetirizine (ZYRTEC ALLERGY) 10 MG tablet, Take 1 tablet (10 mg total) by mouth daily., Disp: 30 tablet, Rfl: 5 .  triamcinolone cream (KENALOG) 0.5 %, Apply 1 application topically 2 (two) times daily. Apply over arms and legs., Disp: 60 g, Rfl: 1 .  albuterol (PROVENTIL HFA;VENTOLIN HFA) 108 (90 Base) MCG/ACT inhaler, Inhale into the lungs. Inhale 1-2 puffs every six hours as needed for wheezing., Disp: , Rfl:   Allergies  Allergen Reactions  . Aspirin Other (See Comments)    Tinnitus   . Penicillins Nausea And Vomiting    Abdominal bloating  . Latex Rash   Review of Systems Objective:   Vitals:   02/10/18 1450  BP: 122/66  Pulse: 64  Resp: 16  Temp: 98.6 F (37 C)   General AA&O x3. Normal mood and affect.  Vascular Dorsalis pedis and posterior tibial pulses  present 2+ bilaterally  Capillary refill normal to all digits. Pedal hair growth normal.  Neurologic Epicritic sensation grossly present.  Dermatologic Xerosis with scaling to the plantar foot bilateral Interspaces clear of maceration. Nails well groomed and normal in  appearance. Painful ingrowing nail at medial nail borders of the hallux nail right.  Orthopedic: MMT 5/5 in dorsiflexion, plantarflexion, inversion, and eversion. Normal joint ROM without pain or crepitus. Pain to palpation about the ingrown nail.   Assessment & Plan:  Patient was evaluated and treated and all questions answered.  Ingrown Nail, right -Patient elects to proceed with ingrown toenail removal today -Ingrown nail excised. See procedure note. -Educated on post-procedure care including soaking. Written instructions provided. -Patient to follow up in 2 weeks for nail check.  Procedure: Excision of Ingrown Toenail Location: Right 1st toe medial nail borders. Anesthesia: Lidocaine 1% plain; 1.5 mL and Marcaine 0.5% plain; 1.5 mL, digital block. Skin Prep: Betadine. Dressing: Silvadene; telfa; dry, sterile, compression dressing. Technique: Following skin prep, the toe was exsanguinated and a tourniquet was secured at the base of the toe. The affected nail border was freed, split with a nail splitter, and excised. Chemical matrixectomy was then performed with phenol and irrigated out with alcohol. The tourniquet was then removed and sterile dressing applied. Disposition: Patient tolerated procedure well. Patient to return in 2 weeks for follow-up.   Tinea Pedis -Rec OTC Antifungal cream  Return in about 2 weeks (around 02/24/2018).

## 2018-02-24 NOTE — Progress Notes (Signed)
  Subjective:  Patient ID: Ronald Hansen, male    DOB: 01/29/1971,  MRN: [id number]  No chief complaint on file.  47 y.o. male returns for the above complaint.  States that he is doing well.  Denies drainage swelling redness.  Objective:   General AA&O x3. Normal mood and affect.  Vascular Foot warm and well perfused with good capillary refill.  Neurologic Sensation grossly intact.  Dermatologic Nail avulsion site healing well without drainage or erythema. Nail bed with overlying soft crust. Left intact. No signs of local infection.  Orthopedic: No tenderness to palpation of the toe.   Assessment & Plan:  Patient was evaluated and treated and all questions answered.  S/p Ingrown Toenail Excision, right -Healing well without issue. -Discussed return precautions. -F/u PRN

## 2018-04-07 ENCOUNTER — Encounter (HOSPITAL_BASED_OUTPATIENT_CLINIC_OR_DEPARTMENT_OTHER): Payer: Self-pay

## 2018-04-07 ENCOUNTER — Emergency Department (HOSPITAL_BASED_OUTPATIENT_CLINIC_OR_DEPARTMENT_OTHER)
Admission: EM | Admit: 2018-04-07 | Discharge: 2018-04-07 | Disposition: A | Discharge status: Home / Self Care | Payer: Self-pay | Attending: Emergency Medicine | Admitting: Emergency Medicine

## 2018-04-07 ENCOUNTER — Emergency Department (HOSPITAL_BASED_OUTPATIENT_CLINIC_OR_DEPARTMENT_OTHER): Payer: Self-pay

## 2018-04-07 ENCOUNTER — Other Ambulatory Visit: Payer: Self-pay

## 2018-04-07 DIAGNOSIS — F17220 Nicotine dependence, chewing tobacco, uncomplicated: Secondary | ICD-10-CM | POA: Insufficient documentation

## 2018-04-07 DIAGNOSIS — Z91040 Latex allergy status: Secondary | ICD-10-CM | POA: Insufficient documentation

## 2018-04-07 DIAGNOSIS — M25562 Pain in left knee: Secondary | ICD-10-CM | POA: Insufficient documentation

## 2018-04-07 DIAGNOSIS — M25462 Effusion, left knee: Secondary | ICD-10-CM | POA: Insufficient documentation

## 2018-04-07 DIAGNOSIS — X500XXA Overexertion from strenuous movement or load, initial encounter: Secondary | ICD-10-CM | POA: Insufficient documentation

## 2018-04-07 DIAGNOSIS — Z79899 Other long term (current) drug therapy: Secondary | ICD-10-CM | POA: Insufficient documentation

## 2018-04-07 NOTE — Discharge Instructions (Signed)
Your x-ray does not show any obvious fracture, but does show a small loose body that may be a cartilage injury.  I would like for you to remain in knee immobilizer with crutches, ice and elevate the knee as much as possible and follow-up with orthopedic surgery.  You may use ibuprofen and Tylenol as needed for pain.  Return to the emergency department for significantly worsened pain, redness, worsening swelling, fevers or if you are unable to bend and straighten the knee at all or any other new or concerning symptoms occur.
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
## 2018-04-07 NOTE — ED Triage Notes (Signed)
Pt states he felt left knee "give out" yesterday-hx of injury to knee and fluid on knee-NAD-to triage in w/c

## 2018-08-06 HISTORY — PX: MENISCUS REPAIR: SHX5179

## 2018-08-07 ENCOUNTER — Telehealth (HOSPITAL_BASED_OUTPATIENT_CLINIC_OR_DEPARTMENT_OTHER): Payer: Self-pay | Admitting: Emergency Medicine

## 2018-08-07 NOTE — Telephone Encounter (Signed)
Pt called in requesting information regarding September 2019 visit about being referred for orthopedic. Pt informed to use discharge paperwork as reference for provider, and if he needs a copy we can provide one for him. Pt informed that he would need to provide ID to receive paperwork. Pt also was informed that if referral is expired he should follow up with PCP for new referral

## 2018-08-08 ENCOUNTER — Inpatient Hospital Stay: Payer: BLUE CROSS/BLUE SHIELD | Admitting: Family Medicine

## 2018-08-08 ENCOUNTER — Emergency Department (HOSPITAL_BASED_OUTPATIENT_CLINIC_OR_DEPARTMENT_OTHER)
Admission: EM | Admit: 2018-08-08 | Discharge: 2018-08-08 | Disposition: A | Discharge status: Home / Self Care | Payer: BLUE CROSS/BLUE SHIELD | Attending: Emergency Medicine | Admitting: Emergency Medicine

## 2018-08-08 ENCOUNTER — Other Ambulatory Visit: Payer: Self-pay

## 2018-08-08 ENCOUNTER — Encounter (HOSPITAL_BASED_OUTPATIENT_CLINIC_OR_DEPARTMENT_OTHER): Payer: Self-pay

## 2018-08-08 ENCOUNTER — Telehealth: Payer: Self-pay | Admitting: Family Medicine

## 2018-08-08 ENCOUNTER — Emergency Department (HOSPITAL_BASED_OUTPATIENT_CLINIC_OR_DEPARTMENT_OTHER): Payer: BLUE CROSS/BLUE SHIELD

## 2018-08-08 DIAGNOSIS — Y939 Activity, unspecified: Secondary | ICD-10-CM | POA: Diagnosis not present

## 2018-08-08 DIAGNOSIS — S93401A Sprain of unspecified ligament of right ankle, initial encounter: Secondary | ICD-10-CM

## 2018-08-08 DIAGNOSIS — F17220 Nicotine dependence, chewing tobacco, uncomplicated: Secondary | ICD-10-CM | POA: Insufficient documentation

## 2018-08-08 DIAGNOSIS — W502XXA Accidental twist by another person, initial encounter: Secondary | ICD-10-CM | POA: Diagnosis not present

## 2018-08-08 DIAGNOSIS — S99921A Unspecified injury of right foot, initial encounter: Secondary | ICD-10-CM | POA: Diagnosis present

## 2018-08-08 DIAGNOSIS — Y929 Unspecified place or not applicable: Secondary | ICD-10-CM | POA: Insufficient documentation

## 2018-08-08 DIAGNOSIS — Z91040 Latex allergy status: Secondary | ICD-10-CM | POA: Insufficient documentation

## 2018-08-08 DIAGNOSIS — Z79899 Other long term (current) drug therapy: Secondary | ICD-10-CM | POA: Diagnosis not present

## 2018-08-08 DIAGNOSIS — S92354A Nondisplaced fracture of fifth metatarsal bone, right foot, initial encounter for closed fracture: Secondary | ICD-10-CM | POA: Diagnosis not present

## 2018-08-08 DIAGNOSIS — Y990 Civilian activity done for income or pay: Secondary | ICD-10-CM | POA: Diagnosis not present

## 2018-08-08 DIAGNOSIS — S62346A Nondisplaced fracture of base of fifth metacarpal bone, right hand, initial encounter for closed fracture: Secondary | ICD-10-CM

## 2018-08-08 MED ORDER — TRAMADOL HCL 50 MG PO TABS: 50.0000 mg | ORAL_TABLET | Freq: Four times a day (QID) | ORAL | 0 refills | Status: DC | PRN

## 2018-08-08 MED FILL — traMADol HCL 50 MG TABS: 50 | 3 days supply | Qty: 15 | Fill #0

## 2018-08-08 NOTE — ED Notes (Signed)
Patient refused crutches. Patient said that he has some at home.

## 2018-08-08 NOTE — ED Provider Notes (Signed)
[facility] EMERGENCY DEPARTMENT Provider Note   CSN: [id number] Arrival date & time: 08/08/18  1105     History   Chief Complaint Chief Complaint  Patient presents with  . Ankle Injury    HPI Ronald Hansen is a 48 y.o. male.  HPI Patient presents to the emergency department with a right ankle and foot injury.  The patient states that he was at work when his ankle and foot twisted.  The patient states he has pain along the lateral aspect of the foot.  Patient also has some ankle pain as well.  Patient states he did not have any other injuries.  Patient denies any weakness or numbness in the foot or leg. Past Medical History:  Diagnosis Date  . Allergy   . Arthritis   . History of chicken pox   . History of hay fever   . History of kidney stones   . Hyperlipidemia   . Impaired fasting glucose   . Kidney stone   . Obesity     Patient Active Problem List   Diagnosis Date Noted  . Hypersomnia 11/06/2016  . Other and unspecified hyperlipidemia 11/06/2012  . Elevated transaminase level 11/06/2012  . Obesity 11/06/2012  . Insulin resistance 11/06/2012    Past Surgical History:  Procedure Laterality Date  . APPENDECTOMY    . cyst removal breast    . FOOT SURGERY    . HERNIA REPAIR    . OTHER SURGICAL HISTORY Left    bone graft left ankle  . WISDOM TOOTH EXTRACTION          Home Medications    Prior to Admission medications   Medication Sig Start Date End Date Taking? Authorizing Provider  albuterol (PROVENTIL HFA;VENTOLIN HFA) 108 (90 Base) MCG/ACT inhaler Inhale into the lungs. Inhale 1-2 puffs every six hours as needed for wheezing. 09/17/15 09/16/16  [provider]  cetirizine (ZYRTEC ALLERGY) 10 MG tablet Take 1 tablet (10 mg total) by mouth daily. 01/01/18   Sharlene Dory, DO  triamcinolone cream (KENALOG) 0.5 % Apply 1 application topically 2 (two) times daily. Apply over arms and legs. 01/01/18   Sharlene Dory,  DO    Family History Family History  Problem Relation Age of Onset  . COPD Father   . Cancer Father 44       Prostate Cancer   . Stroke Father   . Hypertension Father   . Hyperlipidemia Paternal Grandmother   . Arthritis Paternal Grandmother   . Hypertension Paternal Grandmother   . Arthritis Mother   . Cancer Mother        Colon   . Hyperlipidemia Mother   . Hypertension Mother     Social History Social History   Tobacco Use  . Smoking status: Never Smoker  . Smokeless tobacco: Current User    Types: Chew  Substance Use Topics  . Alcohol use: Yes    Comment: occ  . Drug use: No     Allergies   Aspirin; Penicillins; and Latex   Review of Systems Review of Systems All other systems negative except as documented in the HPI. All pertinent positives and negatives as reviewed in the HPI.  Physical Exam Updated Vital Signs BP (!) 170/102 (BP Location: Right Arm)   Pulse 74   Temp 98.2 F (36.8 C) (Oral)   Resp 18   Ht 5\' 10"  (1.778 m)   Wt 99.8 kg   SpO2 100%   BMI 31.57  kg/m   Physical Exam Vitals signs and nursing note reviewed.  Constitutional:      General: He is not in acute distress.    Appearance: He is well-developed.  HENT:     Head: Normocephalic and atraumatic.  Eyes:     Pupils: Pupils are equal, round, and reactive to light.  Pulmonary:     Effort: Pulmonary effort is normal.  Musculoskeletal:     Right ankle: He exhibits decreased range of motion and swelling. He exhibits no ecchymosis, no laceration and normal pulse. Tenderness. Lateral malleolus tenderness found. Achilles tendon normal.       Feet:  Skin:    General: Skin is warm and dry.  Neurological:     Mental Status: He is alert and oriented to person, place, and time.      ED Treatments / Results  Labs (all labs ordered are listed, but only abnormal results are displayed) Labs Reviewed - No data to display  EKG None  Radiology Dg Ankle Complete Right  Result  Date: 08/08/2018 CLINICAL DATA:  Right ankle pain after injury today. EXAM: RIGHT ANKLE - COMPLETE 3+ VIEW COMPARISON:  None. FINDINGS: There is no evidence of fracture, dislocation, or joint effusion. There is no evidence of arthropathy or other focal bone abnormality. Soft tissues are unremarkable. IMPRESSION: Negative. Electronically Signed   By: Lupita Raider, M.D.   On: 08/08/2018 12:12   Dg Foot Complete Right  Result Date: 08/08/2018 CLINICAL DATA:  Right foot pain after injury today. EXAM: RIGHT FOOT COMPLETE - 3+ VIEW COMPARISON:  None. FINDINGS: There appears to be a acute to subacute fracture involving the proximal portion of the fifth metatarsal. No other bony abnormality is noted. Joint spaces are intact. No soft tissue abnormality is noted. IMPRESSION: Acute to subacute fracture seen involving proximal fifth metatarsal. Electronically Signed   By: Lupita Raider, M.D.   On: 08/08/2018 12:14    Procedures Procedures (including critical care time)  Medications Ordered in ED Medications - No data to display   Initial Impression / Assessment and Plan / ED Course  I have reviewed the triage vital signs and the nursing notes.  Pertinent labs & imaging results that were available during my care of the patient were reviewed by me and considered in my medical decision making (see chart for details).    SPLINT APPLICATION Date/Time: 1:24 PM Authorized by: Jamesetta Orleans Milly Goggins Consent: Verbal consent obtained. Risks and benefits: risks, benefits and alternatives were discussed Consent given by: patient Splint applied by: orthopedic technician Location details: R lower leg Splint type: short leg posterior Supplies used: fiberglass web roll and ace Post-procedure: The splinted body part was neurovascularly unchanged following the procedure. Patient tolerance: Patient tolerated the procedure well with no immediate complications.   Patient has an orthopedist and he has been referred  back to him.  I advised the patient to return here as needed.  Told to ice and elevate the foot.  Patient agrees with plan and all questions were answered.  Final Clinical Impressions(s) / ED Diagnoses   Final diagnoses:  None    ED Discharge Orders    None       Jerron, Flair 08/08/18 1325    Azalia Bilis, MD 08/08/18 1520

## 2018-08-08 NOTE — Discharge Instructions (Addendum)
Return here as needed.  Contact your orthopedist for follow-up.  Ice and elevate the foot.

## 2018-08-08 NOTE — Telephone Encounter (Signed)
Copied from CRM [id number]. Topic: Appointment Scheduling - Scheduling Inquiry for Clinic >> Aug 08, 2018  9:35 AM Lynne Logan D wrote: Reason for CRM: Pt would like to know if Dr. Carmelia Roller can fit him in today. He he is having ankle pain and believes he rolled his ankle. Please advise. CB# [phone]   MESSAGE TAKEN BY RN AND THIS PATIENT CURRENTLY IN THE ER

## 2018-08-08 NOTE — Telephone Encounter (Signed)
Have him come at 245. TY.

## 2018-08-08 NOTE — ED Notes (Signed)
Pt refused crutches d/t he already has a pair at home

## 2018-08-08 NOTE — ED Triage Notes (Signed)
Pt states he rolled right ankle/foot today ~1.5 hrs PTA-NAD-limping gait

## 2019-03-02 ENCOUNTER — Ambulatory Visit: Payer: BC Managed Care – PPO | Admitting: Family Medicine

## 2019-03-02 ENCOUNTER — Encounter: Payer: Self-pay | Admitting: Family Medicine

## 2019-03-02 ENCOUNTER — Other Ambulatory Visit: Payer: Self-pay

## 2019-03-02 VITALS — BP 128/82 | HR 81 | Temp 98.6°F | Ht 70.0 in | Wt 264.2 lb

## 2019-03-02 DIAGNOSIS — B356 Tinea cruris: Secondary | ICD-10-CM

## 2019-03-02 MED ORDER — TRIAMCINOLONE ACETONIDE 0.1 % EX CREA: 1.0000 "application " | TOPICAL_CREAM | Freq: Two times a day (BID) | CUTANEOUS | 0 refills | Status: DC

## 2019-03-02 MED ORDER — KETOCONAZOLE 2 % EX CREA: 1.0000 "application " | TOPICAL_CREAM | Freq: Every day | CUTANEOUS | 0 refills | Status: AC

## 2019-03-02 NOTE — Patient Instructions (Signed)
Try not to itch.  Use the ketoconazole on affected area.  Use the Kenalog cream over the thighs and buttock. Do not use over scrotum.  Ice/cold pack over area for 10-15 min twice daily.  Try to remove triggers.  Let us know if you need anything.

## 2019-03-02 NOTE — Progress Notes (Signed)
Chief Complaint  Patient presents with  . Recurrent Skin Infections    Ronald Hansen is a 48 y.o. male here for a skin complaint.  Duration: 3 weeks Location: groin Pruritic? Yes Painful? No Drainage? No New soaps/lotions/topicals/detergents? No Sick contacts? No Other associated symptoms: redness Therapies tried thus far: Gold Bond, Tinactin, Lotrimin, freq showers/underwear changes  ROS:  Const: No fevers Skin: As noted in HPI  Past Medical History:  Diagnosis Date  . Allergy   . Arthritis   . History of chicken pox   . History of hay fever   . History of kidney stones   . Hyperlipidemia   . Impaired fasting glucose   . Kidney stone   . Obesity     BP 128/82 (BP Location: Left Arm, Patient Position: Sitting, Cuff Size: Large)   Pulse 81   Temp 98.6 F (37 C) (Oral)   Ht 5\' 10"  (1.778 m)   Wt 264 lb 4 oz (119.9 kg)   SpO2 96%   BMI 37.92 kg/m  Gen: awake, alert, appearing stated age Lungs: No accessory muscle use Skin: hyperpigmentation, mild erythema in inguinal region b/l, +central clearing. No drainage, TTP, fluctuance, excoriation Psych: Age appropriate judgment and insight  Tinea cruris - Plan: ketoconazole (NIZORAL) 2 % cream, triamcinolone cream (KENALOG) 0.1 %, ice, try not to itch  Orders as above. F/u prn. The patient voiced understanding and agreement to the plan.  Gering, DO 03/02/19 1:45 PM

## 2019-03-17 ENCOUNTER — Ambulatory Visit (INDEPENDENT_AMBULATORY_CARE_PROVIDER_SITE_OTHER): Payer: BC Managed Care – PPO | Admitting: Family Medicine

## 2019-03-17 ENCOUNTER — Other Ambulatory Visit: Payer: Self-pay | Admitting: Family Medicine

## 2019-03-17 ENCOUNTER — Encounter: Payer: Self-pay | Admitting: Family Medicine

## 2019-03-17 ENCOUNTER — Other Ambulatory Visit: Payer: Self-pay

## 2019-03-17 VITALS — BP 128/80 | HR 70 | Temp 98.7°F | Ht 70.0 in | Wt 256.2 lb

## 2019-03-17 DIAGNOSIS — Z125 Encounter for screening for malignant neoplasm of prostate: Secondary | ICD-10-CM

## 2019-03-17 DIAGNOSIS — Z0000 Encounter for general adult medical examination without abnormal findings: Secondary | ICD-10-CM

## 2019-03-17 DIAGNOSIS — E785 Hyperlipidemia, unspecified: Secondary | ICD-10-CM

## 2019-03-17 LAB — COMPREHENSIVE METABOLIC PANEL
ALT: 24 U/L (ref 0–53)
AST: 18 U/L (ref 0–37)
Albumin: 4.5 g/dL (ref 3.5–5.2)
Alkaline Phosphatase: 81 U/L (ref 39–117)
BUN: 17 mg/dL (ref 6–23)
CO2: 28 mEq/L (ref 19–32)
Calcium: 9.8 mg/dL (ref 8.4–10.5)
Chloride: 104 mEq/L (ref 96–112)
Creatinine, Ser: 0.75 mg/dL (ref 0.40–1.50)
GFR: 112.2 mL/min (ref >60.00)
Glucose, Bld: 92 mg/dL (ref 70–99)
Potassium: 4.2 mEq/L (ref 3.5–5.1)
Sodium: 139 mEq/L (ref 135–145)
Total Bilirubin: 0.6 mg/dL (ref 0.2–1.2)
Total Protein: 7 g/dL (ref 6.0–8.3)

## 2019-03-17 LAB — CBC
HCT: 46 % (ref 39.0–52.0)
Hemoglobin: 15.7 g/dL (ref 13.0–17.0)
MCHC: 34.2 g/dL (ref 30.0–36.0)
MCV: 90.6 fl (ref 78.0–100.0)
Platelets: 211 10*3/uL (ref 150.0–400.0)
RBC: 5.08 Mil/uL (ref 4.22–5.81)
RDW: 12.7 % (ref 11.5–15.5)
WBC: 6.1 10*3/uL (ref 4.0–10.5)

## 2019-03-17 LAB — LIPID PANEL
Cholesterol: 226 mg/dL — ABNORMAL HIGH (ref 0–200)
HDL: 46.2 mg/dL (ref >39.00)
LDL Cholesterol: 161 mg/dL — ABNORMAL HIGH (ref 0–99)
NonHDL: 179.81
Total CHOL/HDL Ratio: 5
Triglycerides: 95 mg/dL (ref 0.0–149.0)
VLDL: 19 mg/dL (ref 0.0–40.0)

## 2019-03-17 LAB — PSA: PSA: 0.5 ng/mL (ref 0.10–4.00)

## 2019-03-17 NOTE — Patient Instructions (Addendum)
Give [place] 2-3 business days to get the results of your labs back.   Keep the diet clean and stay active.  Consider getting your flu shot in mid October.  Let us know if you need anything.

## 2019-03-17 NOTE — Progress Notes (Signed)
Chief Complaint  Patient presents with  . Annual Exam    Well Male Ronald Hansen is here for a complete physical.   His last physical was >1 year ago.  Current diet: in general, a "healthy" diet.   Current exercise: active at work, some cycling, stretching Weight trend: has intentinally lost wt Daytime fatigue? No. Seat belt? Yes.    Health maintenance Tetanus- Yes HIV- Yes  Past Medical History:  Diagnosis Date  . Allergy   . Arthritis   . History of chicken pox   . History of hay fever   . History of kidney stones   . Hyperlipidemia   . Impaired fasting glucose   . Kidney stone   . Obesity      Past Surgical History:  Procedure Laterality Date  . APPENDECTOMY    . cyst removal breast    . FOOT SURGERY    . HERNIA REPAIR    . OTHER SURGICAL HISTORY Left    bone graft left ankle  . WISDOM TOOTH EXTRACTION      Medications  Current Outpatient Medications on File Prior to Visit  Medication Sig Dispense Refill  . cetirizine (ZYRTEC ALLERGY) 10 MG tablet Take 1 tablet (10 mg total) by mouth daily. 30 tablet 5  . traMADol (ULTRAM) 50 MG tablet Take 1 tablet (50 mg total) by mouth every 6 (six) hours as needed for severe pain. 15 tablet 0  . triamcinolone cream (KENALOG) 0.1 % Apply 1 application topically 2 (two) times daily. 30 g 0  . albuterol (PROVENTIL HFA;VENTOLIN HFA) 108 (90 Base) MCG/ACT inhaler Inhale into the lungs. Inhale 1-2 puffs every six hours as needed for wheezing.     Allergies Allergies  Allergen Reactions  . Aspirin Other (See Comments)    Tinnitus   . Penicillins Nausea And Vomiting    Abdominal bloating  . Latex Rash    Family History Family History  Problem Relation Age of Onset  . COPD Father   . Cancer Father 6       Prostate Cancer   . Stroke Father   . Hypertension Father   . Hyperlipidemia Paternal Grandmother   . Arthritis Paternal Grandmother   . Hypertension Paternal Grandmother   . Arthritis Mother   . Cancer  Mother        Colon   . Hyperlipidemia Mother   . Hypertension Mother     Review of Systems: Constitutional: no fevers or chills Eye:  no recent significant change in vision Ear/Nose/Mouth/Throat:  Ears:  no tinnitus or hearing loss Nose/Mouth/Throat:  no complaints of nasal congestion, no sore throat Cardiovascular:  no chest pain, no palpitations Respiratory:  no cough and no shortness of breath Gastrointestinal:  no abdominal pain, no change in bowel habits GU:  Male: negative for dysuria, frequency, and incontinence and negative for prostate symptoms Musculoskeletal/Extremities:  no pain, redness, or swelling of the joints Integumentary (Skin/Breast):  no abnormal skin lesions reported Neurologic:  no headaches, no numbness, tingling Endocrine: No unexpected weight changes Hematologic/Lymphatic:  no night sweats  Exam BP 128/80 (BP Location: Left Arm, Patient Position: Sitting, Cuff Size: Large)   Pulse 70   Temp 98.7 F (37.1 C) (Oral)   Ht 5\' 10"  (1.778 m)   Wt 256 lb 4 oz (116.2 kg)   SpO2 97%   BMI 36.77 kg/m  General:  well developed, well nourished, in no apparent distress Skin:  no significant moles, warts, or growths Head:  no  masses, lesions, or tenderness Eyes:  pupils equal and round, sclera anicteric without injection Ears:  canals without lesions, TMs shiny without retraction, no obvious effusion, no erythema Nose:  nares patent, septum midline, mucosa normal Throat/Pharynx:  lips and gingiva without lesion; tongue and uvula midline; non-inflamed pharynx; no exudates or postnasal drainage Neck: neck supple without adenopathy, thyromegaly, or masses Lungs:  clear to auscultation, breath sounds equal bilaterally, no respiratory distress Cardio:  regular rate and rhythm, no bruits, no LE edema Abdomen:  abdomen soft, nontender; bowel sounds normal; no masses or organomegaly Rectal: Deferred Musculoskeletal:  symmetrical muscle groups noted without atrophy or  deformity Extremities:  no clubbing, cyanosis, or edema, no deformities, no skin discoloration Neuro:  gait normal; deep tendon reflexes normal and symmetric Psych: well oriented with normal range of affect and appropriate judgment/insight  Assessment and Plan  Well adult exam - Plan: CBC, Comprehensive metabolic panel, Lipid panel  Screening for malignant neoplasm of prostate - Plan: PSA   Well 48 y.o. male. Counseled on diet and exercise. Counseled on risks and benefits of prostate cancer screening. He agrees to undergo testing. Dad dx'd in early 5860's.  Other orders as above. Follow up in 1 year pending the above workup. The patient voiced understanding and agreement to the plan.  Jilda Rocheicholas Paul Melcher-[place], DO 03/17/19 8:58 AM

## 2019-03-20 ENCOUNTER — Encounter: Payer: Self-pay | Admitting: Family Medicine

## 2019-04-30 ENCOUNTER — Encounter: Payer: Self-pay | Admitting: Internal Medicine

## 2019-04-30 ENCOUNTER — Other Ambulatory Visit: Payer: Self-pay

## 2019-04-30 ENCOUNTER — Ambulatory Visit: Payer: BC Managed Care – PPO | Admitting: Internal Medicine

## 2019-04-30 VITALS — BP 151/102 | HR 81 | Temp 98.6°F | Resp 16 | Ht 70.0 in | Wt 257.5 lb

## 2019-04-30 DIAGNOSIS — B356 Tinea cruris: Secondary | ICD-10-CM

## 2019-04-30 DIAGNOSIS — L282 Other prurigo: Secondary | ICD-10-CM | POA: Diagnosis not present

## 2019-04-30 MED ORDER — TRIAMCINOLONE ACETONIDE 0.1 % EX CREA: 1.0000 "application " | TOPICAL_CREAM | Freq: Two times a day (BID) | CUTANEOUS | 0 refills | Status: DC

## 2019-04-30 MED ORDER — PREDNISONE 10 MG PO TABS: ORAL_TABLET | ORAL | 0 refills | Status: DC

## 2019-04-30 NOTE — Progress Notes (Signed)
Subjective:    Patient ID: Ronald Hansen, male    DOB: Jan 02, 1971, 48 y.o.   MRN: [id number]  DOS:  04/30/2019 Type of visit - description: Acute visit Last night, he noted a rash on the back, it was somewhat itchy.  He took Benadryl and felt better. This morning, he continue the rash on the back and also had extended to the arms and hands.  His hands feel somewhat tight.  Denies exposure to new shampoos, perfumes, detergents etc. They   pain in the exterior of his house few weeks ago with a latex base paint. 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 He is allergic to latex and the last time he was in contact with latex gloves was 2 weeks ago.   BP Readings from Last 3 Encounters:  04/30/19 (!) 151/102  03/17/19 128/80  03/02/19 128/82     Review of Systems Denies fever chills No unusual aches or pains No hip or tongue swelling No shortness of breath, cough or wheezing  Past Medical History:  Diagnosis Date  . Allergy   . Arthritis   . History of chicken pox   . History of hay fever   . History of kidney stones   . Hyperlipidemia   . Impaired fasting glucose   . Kidney stone   . Obesity     Past Surgical History:  Procedure Laterality Date  . APPENDECTOMY    . cyst removal breast    . FOOT SURGERY    . HERNIA REPAIR    . OTHER SURGICAL HISTORY Left    bone graft left ankle  . WISDOM TOOTH EXTRACTION      Social History   Socioeconomic History  . Marital status: Married    Spouse name: Not on file  . Number of children: Not on file  . Years of education: Not on file  . Highest education level: Not on file  Occupational History  . Not on file  Social Needs  . Financial resource strain: Not on file  . Food insecurity    Worry: Not on file    Inability: Not on file  . Transportation needs    Medical: Not on file    Non-medical: Not on file  Tobacco Use  . Smoking status: Never Smoker  . Smokeless  tobacco: Current User    Types: Chew  Substance and Sexual Activity  . Alcohol use: Yes    Comment: occ  . Drug use: No  . Sexual activity: Not on file  Lifestyle  . Physical activity    Days per week: Not on file    Minutes per session: Not on file  . Stress: Not on file  Relationships  . Social Herbalist on phone: Not on file    Gets together: Not on file    Attends religious service: Not on file    Active member of club or organization: Not on file    Attends meetings of clubs or organizations: Not on file    Relationship status: Not on file  . Intimate partner violence    Fear of current or ex partner: Not on file    Emotionally abused: Not on file    Physically abused: Not on file    Forced sexual activity: Not on file  Other Topics Concern  . Not on file  Social History Narrative   Marital Status: Married Programme researcher, broadcasting/film/video)  Children:     Pets:   Living Situation: Lives with spouse    Occupation: Charity fundraiser)    Education: Engineer, agricultural    Tobacco Use/Exposure:  None    Alcohol Use:  Occasional   Drug Use:  None   Diet:  Regular   Exercise:  None   Hobbies:            Allergies as of 04/30/2019      Reactions   Aspirin Other (See Comments)   Tinnitus   Penicillins Nausea And Vomiting   Abdominal bloating   Latex Rash      Medication List       Accurate as of April 30, 2019  2:27 PM. If you have any questions, ask your nurse or doctor.        fexofenadine 60 MG tablet Commonly known as: ALLEGRA Take 60 mg by mouth daily.   predniSONE 10 MG tablet Commonly known as: DELTASONE 4 tablets x 2 days, 3 tabs x 2 days, 2 tabs x 2 days, 1 tab x 2 days Started by: Willow Ora, MD   triamcinolone cream 0.1 % Commonly known as: KENALOG Apply 1 application topically 2 (two) times daily.           Objective:   Physical Exam BP (!) 151/102 (BP Location: Left Arm, Patient Position: Sitting, Cuff Size: Normal)   Pulse 81    Temp 98.6 F (37 C) (Temporal)   Resp 16   Ht 5\' 10"  (1.778 m)   Wt 257 lb 8 oz (116.8 kg)   SpO2 98%   BMI 36.95 kg/m   General:   Well developed, NAD, BMI noted. HEENT:  Normocephalic . Face symmetric, atraumatic.  Lips normal. Skin: + Rash on the back, arms and some at the hands.  Erythema is almost confluent in the back, does not have a Christmas tree distribution.  No specific rash at the inner aspect of the elbows. See pictures. Neurologic:  alert & oriented X3.  Speech normal, gait appropriate for age and unassisted Psych--  Cognition and judgment appear intact.  Cooperative with normal attention span and concentration.  Behavior appropriate. No anxious or depressed appearing.           Assessment     48 year old , PMH includes obesity, presents with Pruritic rash: Rash is  nonspecific, not particularly intense the flex areas of the elbows. No red flag symptoms or systemic symptoms. Plan: Prednisone for few days, continue Allegra, Benadryl if needed, call if not better, ER if severe symptoms.  Consider allergist referral Elevated BP: Recommend patient to check weekly

## 2019-04-30 NOTE — Progress Notes (Signed)
Pre visit review using our clinic review tool, if applicable. No additional management support is needed unless otherwise documented below in the visit note. 

## 2019-04-30 NOTE — Patient Instructions (Addendum)
Take prednisone as prescribed for few days  Continue Allegra  Okay to take Benadryl once a day if needed  Use the triamcinolone lotion on the areas you are itching the most.  Call if the symptoms resurface  Call or go to the ER if  severe symptoms such as tongue or lip swelling or difficulty breathing  Check the  blood pressure weekly  BP GOAL is between 110/65 and  135/85. If it is consistently higher or lower, let me know  Your doctor likes you to recheck your cholesterol next month

## 2019-05-12 ENCOUNTER — Other Ambulatory Visit: Payer: Self-pay

## 2019-05-13 ENCOUNTER — Ambulatory Visit: Payer: BC Managed Care – PPO | Admitting: Family Medicine

## 2019-05-13 ENCOUNTER — Other Ambulatory Visit: Payer: Self-pay

## 2019-05-13 ENCOUNTER — Encounter: Payer: Self-pay | Admitting: Family Medicine

## 2019-05-13 VITALS — BP 120/80 | HR 83 | Temp 98.1°F | Ht 70.0 in | Wt 250.4 lb

## 2019-05-13 DIAGNOSIS — T7840XD Allergy, unspecified, subsequent encounter: Secondary | ICD-10-CM | POA: Diagnosis not present

## 2019-05-13 DIAGNOSIS — E785 Hyperlipidemia, unspecified: Secondary | ICD-10-CM | POA: Diagnosis not present

## 2019-05-13 DIAGNOSIS — L282 Other prurigo: Secondary | ICD-10-CM

## 2019-05-13 LAB — LIPID PANEL
Cholesterol: 236 mg/dL — ABNORMAL HIGH (ref 0–200)
HDL: 45.9 mg/dL (ref >39.00)
LDL Cholesterol: 165 mg/dL — ABNORMAL HIGH (ref 0–99)
NonHDL: 190.11
Total CHOL/HDL Ratio: 5
Triglycerides: 126 mg/dL (ref 0.0–149.0)
VLDL: 25.2 mg/dL (ref 0.0–40.0)

## 2019-05-13 MED ORDER — TRIAMCINOLONE ACETONIDE 0.1 % EX CREA: 1.0000 "application " | TOPICAL_CREAM | Freq: Two times a day (BID) | CUTANEOUS | 0 refills | Status: DC

## 2019-05-13 MED ORDER — LEVOCETIRIZINE DIHYDROCHLORIDE 5 MG PO TABS: 5.0000 mg | ORAL_TABLET | Freq: Every evening | ORAL | 2 refills | Status: DC

## 2019-05-13 NOTE — Progress Notes (Signed)
Chief Complaint  Patient presents with  . Follow-up    rash on arms and hands    Ronald Hansen is a 48 y.o. male here for a skin complaint.  Duration: 4 weeks Location: arms and hands Pruritic? Yes Painful? No Drainage? No New soaps/lotions/topicals/detergents? No Sick contacts? No Other associated symptoms: got better and then came back Therapies tried thus far: pred taper, Kenalog cream  ROS:  Const: No fevers Skin: As noted in HPI  Past Medical History:  Diagnosis Date  . Allergy   . Arthritis   . History of chicken pox   . History of hay fever   . History of kidney stones   . Hyperlipidemia   . Impaired fasting glucose   . Kidney stone   . Obesity     BP 120/80 (BP Location: Left Arm, Patient Position: Sitting, Cuff Size: Normal)   Pulse 83   Temp 98.1 F (36.7 C) (Temporal)   Ht 5\' 10"  (1.778 m)   Wt 250 lb 6 oz (113.6 kg)   SpO2 97%   BMI 35.93 kg/m  Gen: awake, alert, appearing stated age Lungs: No accessory muscle use Skin: some areas of excoriation and erythematous patches over volar forearms b/l. No drainage, TTP, fluctuance Psych: Age appropriate judgment and insight  Pruritic rash - Plan: triamcinolone cream (KENALOG) 0.1 %, Ambulatory referral to Allergy  Allergy, subsequent encounter - Plan: Ambulatory referral to Allergy  Hyperlipidemia, unspecified hyperlipidemia type - Plan: Lipid panel  +hx of hyperactivity with skin.  Will refer to allergy team per his request.  Change Allegra to Xyzal.  Would consider Singulair as a next up as well.  Reorder steroid cream.  Try to avoid scented products.  Add emollient. F/u as originally scheduled otherwise. The patient voiced understanding and agreement to the plan.  [place], DO 05/13/19 10:16 AM

## 2019-05-13 NOTE — Patient Instructions (Addendum)
We are replacing your Allegra with the Xyzal. Xyzal (levocetirizine) is available over the counter.   If you do not hear anything about your referral in the next 1-2 weeks, call our office and ask for an update.  Try not to itch.   Ice/cold pack over area for 10-15 min twice daily.  Use a moisturizer like Vaseline or Aquaphor in addition to your cream.   Let us know if you need anything.

## 2019-05-18 ENCOUNTER — Encounter: Payer: Self-pay | Admitting: Family Medicine

## 2019-05-18 ENCOUNTER — Ambulatory Visit (INDEPENDENT_AMBULATORY_CARE_PROVIDER_SITE_OTHER): Payer: BC Managed Care – PPO | Admitting: Pediatrics

## 2019-05-18 ENCOUNTER — Other Ambulatory Visit: Payer: Self-pay

## 2019-05-18 ENCOUNTER — Encounter: Payer: Self-pay | Admitting: Pediatrics

## 2019-05-18 VITALS — BP 128/100 | HR 78 | Temp 98.3°F | Resp 18 | Ht 68.0 in | Wt 254.6 lb

## 2019-05-18 DIAGNOSIS — E669 Obesity, unspecified: Secondary | ICD-10-CM

## 2019-05-18 DIAGNOSIS — J3089 Other allergic rhinitis: Secondary | ICD-10-CM

## 2019-05-18 DIAGNOSIS — L500 Allergic urticaria: Secondary | ICD-10-CM | POA: Diagnosis not present

## 2019-05-18 MED ORDER — FLUTICASONE PROPIONATE 50 MCG/ACT NA SUSP: NASAL | 5 refills | Status: DC

## 2019-05-18 NOTE — Patient Instructions (Addendum)
Environmental control of dust mite Levocetirizine 5 mg-take 1 tablet once a day for itching or runny nose Fluticasone 2 sprays per nostril once a day if needed for stuffy nose  Do foods with salicylates make you itch? If the itching is not well controlled, take Claritin 10 mg in the morning and levocetirizine 5 mg at night Triamcinolone 0.1% cream twice a day if needed to red itchy areas below the face  I gave you instructions on sources of latex .  These include foods that can be a problem because they cross react with latex.  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  Call us if you are not doing well on this treatment plan Monitor your blood pressure and make sure that it falls

## 2019-05-18 NOTE — Progress Notes (Signed)
[address] [place] [address] Dept: [phone]  New Patient Note  Patient ID: Ronald Hansen, male    DOB: [birth year]  Age: 48 y.o. MRN: [id number] Date of Office Visit: 05/18/2019 Referring provider: Sharlene Dory, DO [address] [place],  [place] [address]    Chief Complaint: Rash  HPI Ronald Hansen presents for evaluation of an itchy rash which started 2 weeks ago.  The rash was on his hands, arms, lower back.  He was treated with prednisone and Kenalog cream.  After he finished prednisone he began to have some itching on and off without a clear-cut reason.  He has had a rash in the past from exposure to latex gloves.  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  He has never had eczema or other contact allergies , except for latex.  He has a history of sneezing and nasal congestion on exposure to dust.  He had tinnitus from aspirin but can tolerate Advil.  He does not have a history of chronic urticaria in the past  Five  years ago he had an episode of bronchitis and was given an albuterol inhaler.  He has not had to use an albuterol inhaler since then and does not have any asthmatic symptoms.  Review of Systems  Constitutional: Negative.   HENT:       Sneezing and nasal congestion for several years  Eyes: Negative.   Respiratory:       Bronchitis 5 years ago needing albuterol inhaler.  No further need for albuterol.  No symptoms of asthma  Cardiovascular: Negative.   Gastrointestinal:       Occasional heartburn.  Appendectomy.  Left inguinal hernia repair  Genitourinary: Negative.   Musculoskeletal:       Torn meniscus left knee.  Left ankle bone graft  Skin:       Itchy rash for 2 weeks.  Improved on prednisone.. Allergy to latex  Neurological: Negative.   Endo/Heme/Allergies:       No diabetes or thyroid disease  Psychiatric/Behavioral: Negative.     Outpatient Encounter Medications as of 05/18/2019   Medication Sig  . levocetirizine (XYZAL) 5 MG tablet Take 1 tablet (5 mg total) by mouth every evening.  . triamcinolone cream (KENALOG) 0.1 % Apply 1 application topically 2 (two) times daily.  . fluticasone (FLONASE) 50 MCG/ACT nasal spray 2 sprays per nostril once a day if needed for stuffy nose   No facility-administered encounter medications on file as of 05/18/2019.      Drug Allergies:  Allergies  Allergen Reactions  . Aspirin Other (See Comments)    Tinnitus   . Penicillins Nausea And Vomiting    Abdominal bloating  . Latex Rash    Family History: Ronald Hansen's family history includes Arthritis in his mother and paternal grandmother; COPD in his father; Cancer in his mother; Cancer (age of onset: 1) in his father; Hyperlipidemia in his mother and paternal grandmother; Hypertension in his father, mother, and paternal grandmother; Stroke in his father..  Family history is positive for allergic rhinitis.  His father had emphysema.  He did smoke cigarettes.  Family history is negative for asthma, sinus problems, angioedema, eczema, hives, food allergies, lupus.  Social and environmental: He has 2 cats in the house.  He is not exposed to cigarette smoking.  He has not smoked cigarettes in the past.  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 He is obese.  HENT:     Head:     Comments: Eyes normal.  Ears normal.  Nose normal.  Pharynx normal. Neck:     Musculoskeletal: Neck supple.     Comments: No thyromegaly Cardiovascular:     Rate and Rhythm: Normal rate and regular rhythm.     Comments: S1-S2 normal no murmur Pulmonary:     Effort: Pulmonary effort is  normal.     Breath sounds: Normal breath sounds.     Comments: Clear to percussion and auscultation Abdominal:     Palpations: Abdomen is soft.     Tenderness: There is no abdominal tenderness.     Comments: No hepatosplenomegaly  Lymphadenopathy:     Cervical: No cervical adenopathy.  Skin:    Comments: Clear  Neurological:     General: No focal deficit present.     Mental Status: He is alert and oriented to person, place, and time. Mental status is at baseline.  Psychiatric:        Mood and Affect: Mood normal.        Behavior: Behavior normal.        Thought Content: Thought content normal.        Judgment: Judgment normal.     Diagnostics: Allergy skin test were positive for dust mite.  Skin testing to foods were negative   Assessment  Assessment and Plan: 1. Allergic urticaria   2. Other allergic rhinitis   3. Obesity (BMI 35.0-39.9 without comorbidity)     Meds ordered this encounter  Medications  . fluticasone (FLONASE) 50 MCG/ACT nasal spray    Sig: 2 sprays per nostril once a day if needed for stuffy nose    Dispense:  18.2 mL    Refill:  5    Patient Instructions  Environmental control of dust mite Levocetirizine 5 mg-take 1 tablet once a day for itching or runny nose Fluticasone 2 sprays per nostril once a day if needed for stuffy nose  Do foods with salicylates make you itch? If the itching is not well controlled, take Claritin 10 mg in the morning and levocetirizine 5 mg at night Triamcinolone 0.1% cream twice a day if needed to red itchy areas below the face  I gave you instructions on sources of latex .  These include foods that can be a problem because they cross react with latex.  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  Call us if you are not doing well on this treatment plan Monitor your blood pressure and make sure that it falls   Return in about 4 weeks (around 06/15/2019).   Thank you for  the opportunity to care for this patient.  Please do not hesitate to contact me with questions.  Penne Lash, M.D.  [facility] [address] [place], [place] [address] [phone]

## 2019-05-19 ENCOUNTER — Other Ambulatory Visit: Payer: Self-pay | Admitting: Family Medicine

## 2019-05-19 DIAGNOSIS — E785 Hyperlipidemia, unspecified: Secondary | ICD-10-CM

## 2019-05-19 MED ORDER — ROSUVASTATIN CALCIUM 10 MG PO TABS: 10.0000 mg | ORAL_TABLET | Freq: Every day | ORAL | 3 refills | Status: DC

## 2019-05-19 NOTE — Progress Notes (Signed)
lipid

## 2019-06-16 ENCOUNTER — Ambulatory Visit: Payer: BC Managed Care – PPO | Admitting: Pediatrics

## 2019-06-16 ENCOUNTER — Other Ambulatory Visit: Payer: Self-pay

## 2019-06-16 ENCOUNTER — Encounter: Payer: Self-pay | Admitting: Pediatrics

## 2019-06-16 VITALS — BP 124/82 | HR 68 | Temp 98.3°F | Resp 16

## 2019-06-16 DIAGNOSIS — J3089 Other allergic rhinitis: Secondary | ICD-10-CM | POA: Diagnosis not present

## 2019-06-16 DIAGNOSIS — L500 Allergic urticaria: Secondary | ICD-10-CM | POA: Diagnosis not present

## 2019-06-16 DIAGNOSIS — E669 Obesity, unspecified: Secondary | ICD-10-CM | POA: Diagnosis not present

## 2019-06-16 NOTE — Progress Notes (Signed)
  [place] [address] Dept: [phone]  FOLLOW UP NOTE  Patient ID: Ronald Hansen, male    DOB: 1970/12/11  Age: 48 y.o. MRN: [id number] Date of Office Visit: 06/16/2019  Assessment  Chief Complaint: Rash (doing alot better)  HPI Ronald Hansen presents for follow-up of urticaria and allergic rhinitis.  The  itching is controlled by using levocetirizine 5 mg once a day.  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  He has been careful about eating and handling foods associated with latex allergy.  He has not had to use triamcinolone cream His nasal symptoms are well controlled with  the use of fluticasone 2 sprays per nostril once a day if needed   Drug Allergies:  Allergies  Allergen Reactions  . Aspirin Other (See Comments)    Tinnitus   . Penicillins Nausea And Vomiting    Abdominal bloating  . Latex Rash    Physical Exam: BP 124/82 (BP Location: Left Arm, Patient Position: Sitting, Cuff Size: Normal)   Pulse 68   Temp 98.3 F (36.8 C) (Temporal)   Resp 16   SpO2 96%    Physical Exam Constitutional:      Appearance: Normal appearance. He is obese.  HENT:     Head:     Comments: Eyes normal.  Ears normal.  Nose normal.  Pharynx normal. Neck:     Musculoskeletal: Neck supple.  Cardiovascular:     Comments: S1-S2 normal no murmurs Pulmonary:     Comments: Clear to percussion and auscultation Abdominal:     Palpations: Abdomen is soft.     Tenderness: There is no abdominal tenderness.     Comments: No hepatosplenomegaly  Lymphadenopathy:     Cervical: No cervical adenopathy.  Skin:    Comments: Clear  Neurological:     General: No focal deficit present.     Mental Status: He is alert and oriented to person, place, and time. Mental status is at baseline.  Psychiatric:        Mood and Affect: Mood normal.        Behavior: Behavior normal.        Thought Content: Thought content normal.        Judgment:  Judgment normal.     Diagnostics:    Assessment and Plan: 1. Allergic urticaria   2. Other allergic rhinitis   3. Obesity (BMI 35.0-39.9 without comorbidity)     No orders of the defined types were placed in this encounter.   Patient Instructions  Levocetirizine 5 mg once a day for itching or runny nose Fluticasone 2 sprays per nostril once a day if needed for stuffy nose for drainage.   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    Thank you for the opportunity to care for this patient.  Please do not hesitate to contact me with questions.  Penne Lash, M.D.  [facility] [address] [place], [place] [address] [phone]

## 2019-06-16 NOTE — Patient Instructions (Addendum)
Levocetirizine 5 mg once a day for itching or runny nose Fluticasone 2 sprays per nostril once a day if needed for stuffy nose for drainage.   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

## 2019-07-15 ENCOUNTER — Ambulatory Visit: Payer: BC Managed Care – PPO | Admitting: Family Medicine

## 2019-07-15 ENCOUNTER — Other Ambulatory Visit: Payer: Self-pay

## 2019-07-15 ENCOUNTER — Other Ambulatory Visit (INDEPENDENT_AMBULATORY_CARE_PROVIDER_SITE_OTHER): Payer: BC Managed Care – PPO

## 2019-07-15 DIAGNOSIS — E785 Hyperlipidemia, unspecified: Secondary | ICD-10-CM | POA: Diagnosis not present

## 2019-07-15 LAB — HEPATIC FUNCTION PANEL
ALT: 23 U/L (ref 0–53)
AST: 18 U/L (ref 0–37)
Albumin: 4.4 g/dL (ref 3.5–5.2)
Alkaline Phosphatase: 72 U/L (ref 39–117)
Bilirubin, Direct: 0.1 mg/dL (ref 0.0–0.3)
Total Bilirubin: 0.6 mg/dL (ref 0.2–1.2)
Total Protein: 6.9 g/dL (ref 6.0–8.3)

## 2019-07-15 LAB — LIPID PANEL
Cholesterol: 171 mg/dL (ref 0–200)
HDL: 51.7 mg/dL (ref >39.00)
LDL Cholesterol: 95 mg/dL (ref 0–99)
NonHDL: 119
Total CHOL/HDL Ratio: 3
Triglycerides: 118 mg/dL (ref 0.0–149.0)
VLDL: 23.6 mg/dL (ref 0.0–40.0)

## 2019-08-07 HISTORY — PX: MENISCUS REPAIR: SHX5179

## 2019-08-09 ENCOUNTER — Other Ambulatory Visit: Payer: Self-pay | Admitting: Family Medicine

## 2019-09-19 ENCOUNTER — Other Ambulatory Visit: Payer: Self-pay | Admitting: Family Medicine

## 2019-11-09 ENCOUNTER — Other Ambulatory Visit: Payer: Self-pay | Admitting: Family Medicine

## 2019-11-24 ENCOUNTER — Other Ambulatory Visit: Payer: Self-pay | Admitting: *Deleted

## 2019-11-24 MED ORDER — FLUTICASONE PROPIONATE 50 MCG/ACT NA SUSP: NASAL | 6 refills | Status: DC

## 2019-11-25 ENCOUNTER — Other Ambulatory Visit: Payer: Self-pay | Admitting: *Deleted

## 2019-11-25 MED ORDER — FLUTICASONE PROPIONATE 50 MCG/ACT NA SUSP: NASAL | 6 refills | Status: AC

## 2020-02-09 ENCOUNTER — Other Ambulatory Visit: Payer: Self-pay | Admitting: Family Medicine

## 2020-02-26 ENCOUNTER — Other Ambulatory Visit: Payer: Self-pay | Admitting: Family Medicine

## 2020-03-04 ENCOUNTER — Encounter: Payer: Self-pay | Admitting: Family Medicine

## 2020-03-04 ENCOUNTER — Other Ambulatory Visit: Payer: Self-pay

## 2020-03-04 ENCOUNTER — Ambulatory Visit (INDEPENDENT_AMBULATORY_CARE_PROVIDER_SITE_OTHER): Payer: BC Managed Care – PPO | Admitting: Family Medicine

## 2020-03-04 VITALS — BP 142/98 | HR 83 | Temp 98.2°F | Ht 70.0 in | Wt 263.2 lb

## 2020-03-04 DIAGNOSIS — M545 Low back pain, unspecified: Secondary | ICD-10-CM

## 2020-03-04 DIAGNOSIS — B353 Tinea pedis: Secondary | ICD-10-CM

## 2020-03-04 DIAGNOSIS — R030 Elevated blood-pressure reading, without diagnosis of hypertension: Secondary | ICD-10-CM | POA: Diagnosis not present

## 2020-03-04 MED ORDER — TERBINAFINE HCL 250 MG PO TABS: 250.0000 mg | ORAL_TABLET | Freq: Every day | ORAL | 0 refills | Status: DC

## 2020-03-04 MED ORDER — PREDNISONE 20 MG PO TABS: 40.0000 mg | ORAL_TABLET | Freq: Every day | ORAL | 0 refills | Status: AC

## 2020-03-04 MED ORDER — CYCLOBENZAPRINE HCL 10 MG PO TABS: 5.0000 mg | ORAL_TABLET | Freq: Three times a day (TID) | ORAL | 0 refills | Status: DC | PRN

## 2020-03-04 NOTE — Progress Notes (Signed)
Musculoskeletal Exam  Patient: Ronald Hansen DOB: [birth year]  DOS: 03/04/2020  SUBJECTIVE:  Chief Complaint:   Chief Complaint  Patient presents with  . Back Pain    Ronald Hansen is a 49 y.o.  male for evaluation and treatment of his back pain.   Onset:  2 weeks ago.  No inj or change in activity.  Location: lower b/l Character:  aching and sharp  Progression of issue:  has worsened Associated symptoms: no bruising, redness, swelling Denies bowel/bladder incontinence or weakness Treatment: to date has been OTC NSAIDS, massage and Doan's.    Neurovascular symptoms: no  Past Medical History:  Diagnosis Date  . Allergy   . Arthritis   . History of chicken pox   . History of hay fever   . History of kidney stones   . Hyperlipidemia   . Impaired fasting glucose   . Kidney stone   . Obesity     Objective:  VITAL SIGNS: BP (!) 142/98 (BP Location: Left Arm, Patient Position: Sitting, Cuff Size: Large)   Pulse 83   Temp 98.2 F (36.8 C) (Oral)   Ht 5\' 10"  (1.778 m)   Wt (!) 263 lb 4 oz (119.4 kg)   SpO2 99%   BMI 37.77 kg/m  Constitutional: Well formed, well developed. No acute distress. HENT: Normocephalic, atraumatic.  Thorax & Lungs:  No accessory muscle use Skin: scaly skin on plantar surface of both feet, no macerated tissue noted.  Musculoskeletal: low back.   Tenderness to palpation: yes over lumbar parasp msc and R ES group Deformity: no Ecchymosis: no Straight leg test: negative for Poor hamstring flexibility b/l. Neurologic: Normal sensory function. No focal deficits noted. DTR's equal and symmetric in LE's. No clonus. Psychiatric: Normal mood. Age appropriate judgment and insight. Alert & oriented x 3.    Assessment:  Acute right-sided low back pain without sciatica - Plan: predniSONE (DELTASONE) 20 MG tablet, cyclobenzaprine (FLEXERIL) 10 MG tablet  Tinea pedis of both feet - Plan: terbinafine (LAMISIL) 250 MG tablet  Elevated  blood pressure reading  Plan: 1. Stretches/exercises, heat, ice, Tylenol, Pred burst. 2. 2 weeks of Lamisil. If no better, refer to derm. 3. Monitor BP at home.  F/u in 1 mo for CPE. The patient voiced understanding and agreement to the plan.   [place], DO 03/04/20  4:10 PM

## 2020-03-04 NOTE — Patient Instructions (Addendum)

## 2020-03-08 ENCOUNTER — Ambulatory Visit: Payer: BC Managed Care – PPO | Admitting: Family Medicine

## 2020-03-08 ENCOUNTER — Encounter: Payer: Self-pay | Admitting: Family Medicine

## 2020-03-09 ENCOUNTER — Other Ambulatory Visit: Payer: Self-pay | Admitting: Family Medicine

## 2020-03-09 DIAGNOSIS — M545 Low back pain, unspecified: Secondary | ICD-10-CM

## 2020-03-14 ENCOUNTER — Telehealth: Payer: Self-pay | Admitting: Family Medicine

## 2020-03-14 NOTE — Telephone Encounter (Signed)
Have faxed

## 2020-03-14 NOTE — Telephone Encounter (Signed)
Per Ronald Hansen, with [facility] there office received a referral for PT.  Per Ronald Hansen, the refferal sent to [place], they would like to if they can send the referral to [facility] office. Patient lives in [place].  Per Ronald Hansen, [facility] need patient insurance information faxed to # [phone]

## 2020-03-18 DIAGNOSIS — M545 Low back pain, unspecified: Secondary | ICD-10-CM

## 2020-03-18 MED ORDER — CYCLOBENZAPRINE HCL 10 MG PO TABS: 5.0000 mg | ORAL_TABLET | Freq: Three times a day (TID) | ORAL | 0 refills | Status: DC | PRN

## 2020-03-25 IMAGING — DX DG FOOT COMPLETE 3+V*R*
3 series · 3 of 3 positions shown · non-contrast
Comparison: None.

CLINICAL DATA: Right foot pain after injury today.

EXAM:
RIGHT FOOT COMPLETE - 3+ VIEW

[foot ap]
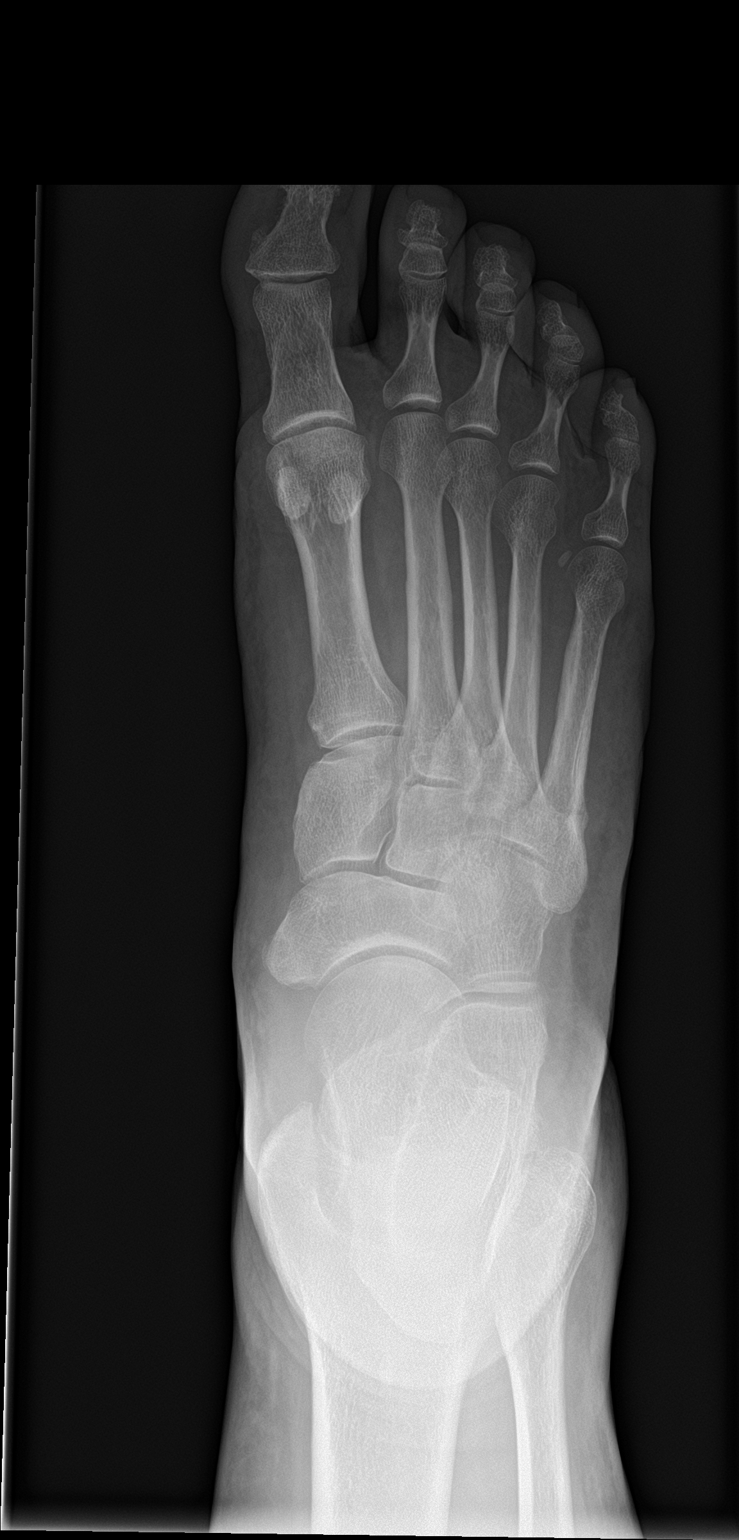

[foot obl]
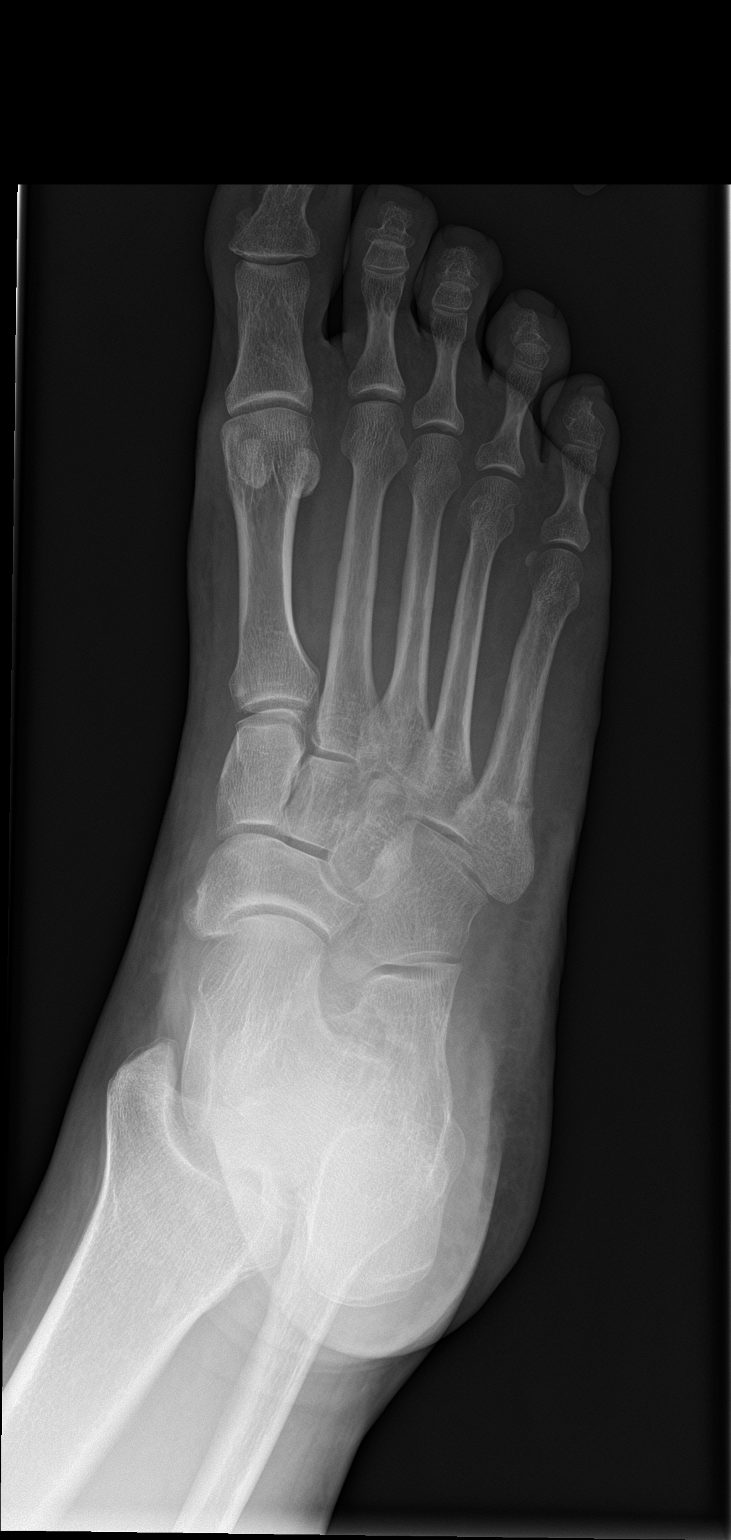

[foot lat]
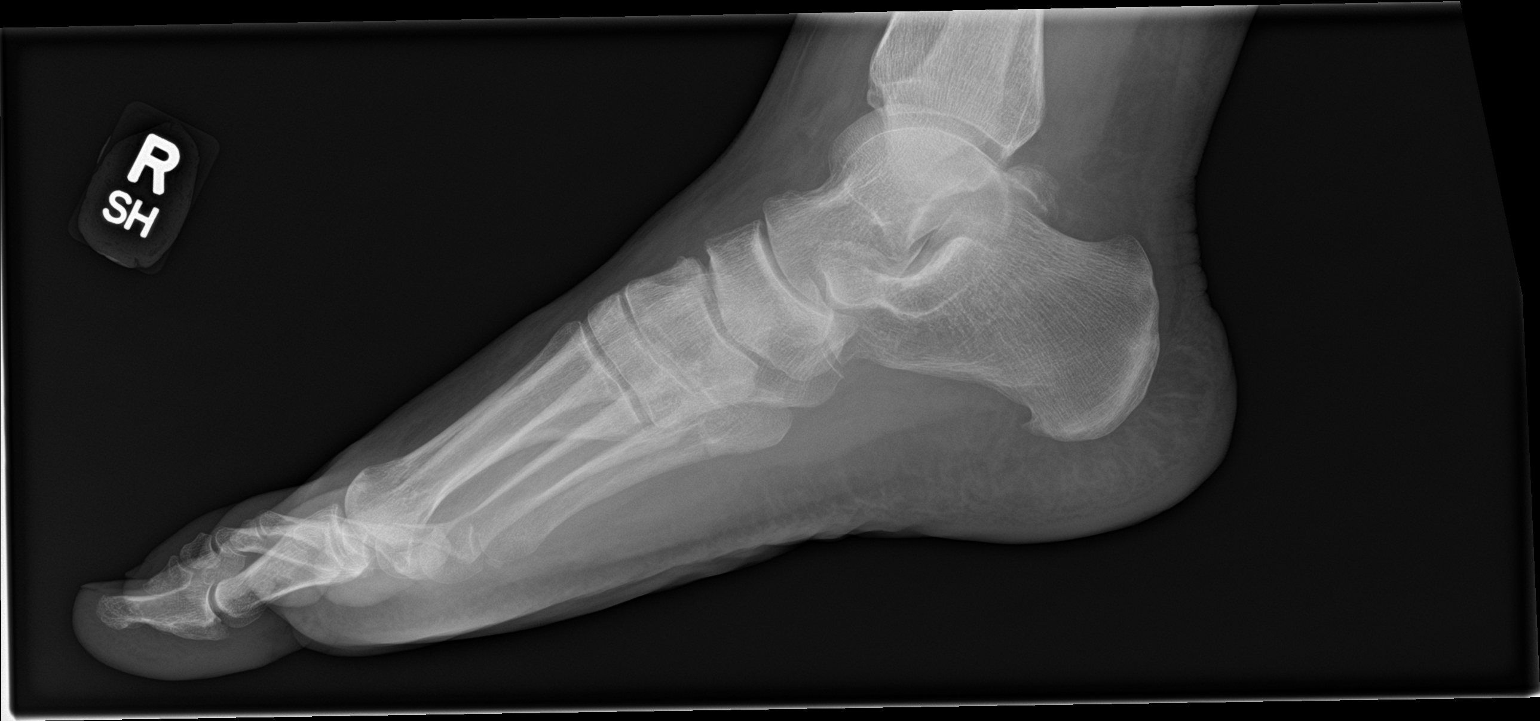

[3 of 3 positions shown; findings below may reference images not displayed]

FINDINGS: There appears to be a acute to subacute fracture involving the
proximal portion of the fifth metatarsal. No other bony abnormality
is noted. Joint spaces are intact. No soft tissue abnormality is
noted.
IMPRESSION: Acute to subacute fracture seen involving proximal fifth metatarsal.

## 2020-04-06 ENCOUNTER — Encounter: Payer: BC Managed Care – PPO | Admitting: Family Medicine

## 2020-04-15 ENCOUNTER — Other Ambulatory Visit: Payer: Self-pay

## 2020-04-15 ENCOUNTER — Encounter: Payer: Self-pay | Admitting: Family Medicine

## 2020-04-15 ENCOUNTER — Ambulatory Visit (INDEPENDENT_AMBULATORY_CARE_PROVIDER_SITE_OTHER): Payer: BC Managed Care – PPO | Admitting: Family Medicine

## 2020-04-15 VITALS — BP 124/98 | HR 98 | Temp 98.0°F | Ht 70.0 in | Wt 259.0 lb

## 2020-04-15 DIAGNOSIS — Z125 Encounter for screening for malignant neoplasm of prostate: Secondary | ICD-10-CM | POA: Diagnosis not present

## 2020-04-15 DIAGNOSIS — I10 Essential (primary) hypertension: Secondary | ICD-10-CM | POA: Diagnosis not present

## 2020-04-15 DIAGNOSIS — Z0000 Encounter for general adult medical examination without abnormal findings: Secondary | ICD-10-CM | POA: Diagnosis not present

## 2020-04-15 DIAGNOSIS — Z1159 Encounter for screening for other viral diseases: Secondary | ICD-10-CM | POA: Diagnosis not present

## 2020-04-15 MED ORDER — AMLODIPINE BESYLATE 5 MG PO TABS: 5.0000 mg | ORAL_TABLET | Freq: Every day | ORAL | 3 refills | Status: DC

## 2020-04-15 NOTE — Progress Notes (Signed)
Chief Complaint  Patient presents with  . Annual Exam    Well Male Ronald Hansen is here for a complete physical.   His last physical was >1 year ago.  Current diet: in general, a "healthy" diet.   Current exercise: some walking, stretches for back Weight trend: down a few lbs Fatigue out of ordinary? No. Seat belt? Yes.    Health maintenance Tetanus- Yes HIV- Yes Hep C- No  Past Medical History:  Diagnosis Date  . Allergy   . Arthritis   . History of chicken pox   . History of hay fever   . History of kidney stones   . Hyperlipidemia   . Impaired fasting glucose   . Kidney stone   . Obesity      Past Surgical History:  Procedure Laterality Date  . APPENDECTOMY    . cyst removal breast    . FOOT SURGERY    . HERNIA REPAIR    . OTHER SURGICAL HISTORY Left    bone graft left ankle  . WISDOM TOOTH EXTRACTION      Medications  Current Outpatient Medications on File Prior to Visit  Medication Sig Dispense Refill  . cyclobenzaprine (FLEXERIL) 10 MG tablet Take 0.5-1 tablets (5-10 mg total) by mouth 3 (three) times daily as needed for muscle spasms. 21 tablet 0  . fluticasone (FLONASE) 50 MCG/ACT nasal spray 2 sprays per nostril once a day if needed for stuffy nose 16 mL 6  . levocetirizine (XYZAL) 5 MG tablet Take 1 tablet (5 mg total) by mouth every evening. 90 tablet 3  . rosuvastatin (CRESTOR) 10 MG tablet TAKE ONE TABLET BY MOUTH ONE TIME DAILY 30 tablet 3   Allergies Allergies  Allergen Reactions  . Aspirin Other (See Comments)    Tinnitus   . Penicillins Nausea And Vomiting    Abdominal bloating  . Latex Rash    Family History Family History  Problem Relation Age of Onset  . COPD Father   . Cancer Father 19       Prostate Cancer   . Stroke Father   . Hypertension Father   . Hyperlipidemia Paternal Grandmother   . Arthritis Paternal Grandmother   . Hypertension Paternal Grandmother   . Arthritis Mother   . Cancer Mother        Colon    . Hyperlipidemia Mother   . Hypertension Mother     Review of Systems: Constitutional: no fevers or chills Eye:  no recent significant change in vision Ear/Nose/Mouth/Throat:  Ears:  no hearing loss Nose/Mouth/Throat:  no complaints of nasal congestion, no sore throat Cardiovascular:  no chest pain Respiratory:  no shortness of breath Gastrointestinal:  no abdominal pain, no change in bowel habits GU:  Male: negative for dysuria, frequency, and incontinence Musculoskeletal/Extremities:  +R knee pain Integumentary (Skin/Breast):  no abnormal skin lesions reported Neurologic:  no headaches Endocrine: No unexpected weight changes Hematologic/Lymphatic:  no night sweats  Exam BP (!) 124/98 (BP Location: Right Arm, Patient Position: Sitting, Cuff Size: Normal)   Pulse 98   Temp 98 F (36.7 C) (Oral)   Ht 5\' 10"  (1.778 m)   Wt 259 lb (117.5 kg)   SpO2 97%   BMI 37.16 kg/m  General:  well developed, well nourished, in no apparent distress Skin:  no significant moles, warts, or growths Head:  no masses, lesions, or tenderness Eyes:  pupils equal and round, sclera anicteric without injection Ears:  canals without lesions, TMs shiny  without retraction, no obvious effusion, no erythema Nose:  nares patent, septum midline, mucosa normal Throat/Pharynx:  lips and gingiva without lesion; tongue and uvula midline; non-inflamed pharynx; no exudates or postnasal drainage Neck: neck supple without adenopathy, thyromegaly, or masses Lungs:  clear to auscultation, breath sounds equal bilaterally, no respiratory distress Cardio:  regular rate and rhythm, no bruits, no LE edema Abdomen:  abdomen soft, nontender; bowel sounds normal; no masses or organomegaly Rectal: Deferred Musculoskeletal:  symmetrical muscle groups noted without atrophy or deformity Extremities:  no clubbing, cyanosis, or edema, no deformities, no skin discoloration Neuro:  gait antalgic; deep tendon reflexes normal and  symmetric Psych: well oriented with normal range of affect and appropriate judgment/insight  Assessment and Plan  Well adult exam - Plan: CBC, Comprehensive metabolic panel, Lipid panel  Encounter for hepatitis C screening test for low risk patient - Plan: Hepatitis C antibody  Screening for malignant neoplasm of prostate - Plan: PSA  Essential hypertension   Well 49 y.o. male. Counseled on diet and exercise. Counseled on risks and benefits of prostate cancer screening with PSA. The patient agrees to undergo screening.  Other orders as above. Start Norvasc. Monitor BP.  Follow up in 1 mo to reck BP pending the above workup. The patient voiced understanding and agreement to the plan.  Jilda Roche [place], DO 04/15/20 3:55 PM

## 2020-04-15 NOTE — Patient Instructions (Addendum)
Give [place] 2-3 business days to get the results of your labs back.   Keep the diet clean and stay active.  Monitor your blood pressure at home 2-3 times per week. Alternate checking at different times during the day. I want your blood pressure to be less than 140/90 routinely. Both top and bottom numbers need to meet this goal   I recommend getting the flu shot in mid October. This suggestion would change if the CDC comes out with a different recommendation.   Let us know if you need anything.

## 2020-04-21 LAB — LIPID PANEL
Cholesterol: 181 mg/dL (ref <200)
HDL: 46 mg/dL (ref >=40)
LDL Cholesterol (Calc): 99 mg/dL (calc)
Non-HDL Cholesterol (Calc): 135 mg/dL (calc) — ABNORMAL HIGH (ref <130)
Total CHOL/HDL Ratio: 3.9 (calc) (ref <5.0)
Triglycerides: 237 mg/dL — ABNORMAL HIGH (ref <150)

## 2020-04-21 LAB — COMPREHENSIVE METABOLIC PANEL
AG Ratio: 1.8 (calc) (ref 1.0–2.5)
ALT: 29 U/L (ref 9–46)
AST: 23 U/L (ref 10–40)
Albumin: 4.5 g/dL (ref 3.6–5.1)
Alkaline phosphatase (APISO): 91 U/L (ref 36–130)
BUN: 16 mg/dL (ref 7–25)
CO2: 24 mmol/L (ref 20–32)
Calcium: 9.3 mg/dL (ref 8.6–10.3)
Chloride: 101 mmol/L (ref 98–110)
Creat: 0.82 mg/dL (ref 0.60–1.35)
Globulin: 2.5 g/dL (calc) (ref 1.9–3.7)
Glucose, Bld: 127 mg/dL — ABNORMAL HIGH (ref 65–99)
Potassium: 4 mmol/L (ref 3.5–5.3)
Sodium: 139 mmol/L (ref 135–146)
Total Bilirubin: 0.5 mg/dL (ref 0.2–1.2)
Total Protein: 7 g/dL (ref 6.1–8.1)

## 2020-04-21 LAB — CBC
HCT: 50.5 % — ABNORMAL HIGH (ref 38.5–50.0)
Hemoglobin: 17.1 g/dL (ref 13.2–17.1)
MCH: 30.9 pg (ref 27.0–33.0)
MCHC: 33.9 g/dL (ref 32.0–36.0)
MCV: 91.3 fL (ref 80.0–100.0)
MPV: 13.2 fL — ABNORMAL HIGH (ref 7.5–12.5)
Platelets: 204 10*3/uL (ref 140–400)
RBC: 5.53 10*6/uL (ref 4.20–5.80)
RDW: 12.4 % (ref 11.0–15.0)
WBC: 9.7 10*3/uL (ref 3.8–10.8)

## 2020-04-21 LAB — HEPATITIS C ANTIBODY
Hepatitis C Ab: NONREACTIVE
SIGNAL TO CUT-OFF: 0.01 (ref <1.00)

## 2020-04-21 LAB — HEMOGLOBIN A1C W/OUT EAG: Hgb A1c MFr Bld: 5.4 % of total Hgb (ref <5.7)

## 2020-04-21 LAB — PSA: PSA: 0.5 ng/mL (ref <=4.0)

## 2020-04-21 LAB — TEST AUTHORIZATION 2

## 2020-05-18 ENCOUNTER — Ambulatory Visit: Payer: BC Managed Care – PPO | Admitting: Family Medicine

## 2021-02-03 ENCOUNTER — Ambulatory Visit: Payer: BC Managed Care – PPO | Admitting: Family Medicine

## 2021-02-10 ENCOUNTER — Other Ambulatory Visit: Payer: Self-pay

## 2021-02-10 ENCOUNTER — Ambulatory Visit (INDEPENDENT_AMBULATORY_CARE_PROVIDER_SITE_OTHER): Payer: BC Managed Care – PPO | Admitting: Family Medicine

## 2021-02-10 ENCOUNTER — Encounter: Payer: Self-pay | Admitting: Family Medicine

## 2021-02-10 VITALS — BP 128/80 | HR 80 | Temp 98.3°F | Ht 70.0 in | Wt 268.1 lb

## 2021-02-10 DIAGNOSIS — Z6835 Body mass index (BMI) 35.0-35.9, adult: Secondary | ICD-10-CM | POA: Diagnosis not present

## 2021-02-10 DIAGNOSIS — M25561 Pain in right knee: Secondary | ICD-10-CM

## 2021-02-10 DIAGNOSIS — G8929 Other chronic pain: Secondary | ICD-10-CM

## 2021-02-10 MED ORDER — CELECOXIB 200 MG PO CAPS: 200.0000 mg | ORAL_CAPSULE | Freq: Two times a day (BID) | ORAL | 3 refills | Status: DC | PRN

## 2021-02-10 NOTE — Progress Notes (Signed)
Chief Complaint  Patient presents with   Follow-up    Subjective: Patient is a 50 y.o. male here for f/u knee pain.  Pt had a scope last year that went well.  We did find arthritis in his knee.  He received an injection that helped for 3 months.  He has been taking Celebrex 200 mg which does help.  He is wondering if this is something he should be able to stay on longer term.  He is not having any adverse effects.  It seems to work better than Aleve and Advil.  His knees are more bothersome on days where he works.  Patient's diet could be better, he does not exercise routinely right now.  He has been trying to improve his diet with his wife.  Past Medical History:  Diagnosis Date   Allergy    Arthritis    History of chicken pox    History of hay fever    History of kidney stones    Hyperlipidemia    Impaired fasting glucose    Kidney stone    Obesity     Objective: BP 128/80   Pulse 80   Temp 98.3 F (36.8 C) (Oral)   Ht 5\' 10"  (1.778 m)   Wt 268 lb 2 oz (121.6 kg)   SpO2 97%   BMI 38.47 kg/m  General: Awake, appears stated age MSK: No ttp over knee, nml active/passive ROm, no effusion/deformity, neg Lachman's, Stine's, varus/valgus stress Lungs: CTAB, no rales, wheezes or rhonchi. No accessory muscle use Psych: Age appropriate judgment and insight, normal affect and mood  Assessment and Plan: Chronic pain of right knee  Severe obesity (BMI 35.0-35.9 with comorbidity) (HCC) - Plan: Amb Ref to Medical Weight Management  Chronic, uncontrolled.  Will continue Celebrex as needed.  I think weight loss will help offset the need for this.  Tylenol as an adjunct.  Consider ice. Chronic, uncontrolled.  Refer to the weight loss team. Follow-up as originally scheduled in September for physical.  Of note, his blood pressure was normal today and he has not been taking his Norvasc.  We will remove it from his list and he will monitor at home. The patient voiced understanding and  agreement to the plan.  October Mar[place]hal, DO 02/10/21  9:27 AM

## 2021-02-10 NOTE — Patient Instructions (Addendum)
OK to use Tylenol with the Celebrex. I am fine to keep you on this for now.   Ice/cold pack over area for 10-15 min twice daily.  If you do not hear anything about your referral in the next 1-2 weeks, call our office and ask for an update.  Check your blood pressures 2-3 times per week, alternating the time of day you check it. If it is high, considering waiting 1-2 minutes and rechecking. If it gets higher, your anxiety is likely creeping up and we should avoid rechecking.   Let us know if you need anything.

## 2021-02-27 ENCOUNTER — Other Ambulatory Visit: Payer: Self-pay | Admitting: Family Medicine

## 2021-04-03 MED ORDER — CELECOXIB 200 MG PO CAPS: 200.0000 mg | ORAL_CAPSULE | Freq: Two times a day (BID) | ORAL | 3 refills | Status: DC | PRN

## 2021-04-18 ENCOUNTER — Other Ambulatory Visit: Payer: Self-pay

## 2021-04-18 ENCOUNTER — Ambulatory Visit (INDEPENDENT_AMBULATORY_CARE_PROVIDER_SITE_OTHER): Payer: BC Managed Care – PPO | Admitting: Family Medicine

## 2021-04-18 ENCOUNTER — Encounter: Payer: Self-pay | Admitting: Family Medicine

## 2021-04-18 VITALS — BP 122/86 | HR 74 | Temp 98.2°F | Ht 70.0 in | Wt 267.4 lb

## 2021-04-18 DIAGNOSIS — Z0000 Encounter for general adult medical examination without abnormal findings: Secondary | ICD-10-CM | POA: Diagnosis not present

## 2021-04-18 DIAGNOSIS — Z125 Encounter for screening for malignant neoplasm of prostate: Secondary | ICD-10-CM

## 2021-04-18 DIAGNOSIS — Z1211 Encounter for screening for malignant neoplasm of colon: Secondary | ICD-10-CM

## 2021-04-18 DIAGNOSIS — Z0289 Encounter for other administrative examinations: Secondary | ICD-10-CM

## 2021-04-18 LAB — LIPID PANEL
Cholesterol: 230 mg/dL — ABNORMAL HIGH (ref 0–200)
HDL: 46.7 mg/dL (ref >39.00)
LDL Cholesterol: 151 mg/dL — ABNORMAL HIGH (ref 0–99)
NonHDL: 183.57
Total CHOL/HDL Ratio: 5
Triglycerides: 162 mg/dL — ABNORMAL HIGH (ref 0.0–149.0)
VLDL: 32.4 mg/dL (ref 0.0–40.0)

## 2021-04-18 LAB — COMPREHENSIVE METABOLIC PANEL
ALT: 25 U/L (ref 0–53)
AST: 17 U/L (ref 0–37)
Albumin: 4.2 g/dL (ref 3.5–5.2)
Alkaline Phosphatase: 86 U/L (ref 39–117)
BUN: 13 mg/dL (ref 6–23)
CO2: 28 mEq/L (ref 19–32)
Calcium: 9.5 mg/dL (ref 8.4–10.5)
Chloride: 103 mEq/L (ref 96–112)
Creatinine, Ser: 0.72 mg/dL (ref 0.40–1.50)
GFR: 108.47 mL/min (ref >60.00)
Glucose, Bld: 90 mg/dL (ref 70–99)
Potassium: 4 mEq/L (ref 3.5–5.1)
Sodium: 140 mEq/L (ref 135–145)
Total Bilirubin: 0.5 mg/dL (ref 0.2–1.2)
Total Protein: 6.8 g/dL (ref 6.0–8.3)

## 2021-04-18 LAB — CBC
HCT: 48.9 % (ref 39.0–52.0)
Hemoglobin: 16.5 g/dL (ref 13.0–17.0)
MCHC: 33.8 g/dL (ref 30.0–36.0)
MCV: 90.8 fl (ref 78.0–100.0)
Platelets: 208 10*3/uL (ref 150.0–400.0)
RBC: 5.38 Mil/uL (ref 4.22–5.81)
RDW: 12.4 % (ref 11.5–15.5)
WBC: 6.3 10*3/uL (ref 4.0–10.5)

## 2021-04-18 LAB — PSA: PSA: 0.5 ng/mL (ref 0.10–4.00)

## 2021-04-18 NOTE — Progress Notes (Signed)
Chief Complaint  Patient presents with   Annual Exam    Well Male Ronald Hansen is here for a complete physical.   His last physical was >1 year ago.  Current diet: in general, diet is fair.   Current exercise: none Weight trend: stable Fatigue out of ordinary? No. Seat belt? Yes.    Health maintenance Tetanus- Yes HIV- Yes Hep C- Yes CCS- No  Past Medical History:  Diagnosis Date   Allergy    Arthritis    History of chicken pox    History of hay fever    History of kidney stones    Hyperlipidemia    Impaired fasting glucose    Kidney stone    Obesity      Past Surgical History:  Procedure Laterality Date   APPENDECTOMY     cyst removal breast     FOOT SURGERY     HERNIA REPAIR     OTHER SURGICAL HISTORY Left    bone graft left ankle   WISDOM TOOTH EXTRACTION      Medications  Current Outpatient Medications on File Prior to Visit  Medication Sig Dispense Refill   celecoxib (CELEBREX) 200 MG capsule Take 1 capsule (200 mg total) by mouth 2 (two) times daily as needed (Pain). 60 capsule 3   fluticasone (FLONASE) 50 MCG/ACT nasal spray 2 sprays per nostril once a day if needed for stuffy nose 16 mL 6   levocetirizine (XYZAL) 5 MG tablet TAKE ONE TABLET BY MOUTH EVERY EVENING 90 tablet 1   Allergies Allergies  Allergen Reactions   Aspirin Other (See Comments)    Tinnitus    Penicillins Nausea And Vomiting    Abdominal bloating   Latex Rash    Family History Family History  Problem Relation Age of Onset   COPD Father    Cancer Father 36       Prostate Cancer    Stroke Father    Hypertension Father    Hyperlipidemia Paternal Grandmother    Arthritis Paternal Grandmother    Hypertension Paternal Grandmother    Arthritis Mother    Cancer Mother        Colon    Hyperlipidemia Mother    Hypertension Mother     Review of Systems: Constitutional: no fevers or chills Eye:  no recent significant change in vision Ear/Nose/Mouth/Throat:   Ears:  no hearing loss Nose/Mouth/Throat:  no complaints of nasal congestion, no sore throat Cardiovascular:  no chest pain Respiratory:  no shortness of breath Gastrointestinal:  no abdominal pain, no change in bowel habits GU:  Male: negative for dysuria, frequency, and incontinence Musculoskeletal/Extremities:  no new pain of the joints Integumentary (Skin/Breast):  no abnormal skin lesions reported Neurologic:  no headaches Endocrine: No unexpected weight changes Hematologic/Lymphatic:  no night sweats  Exam BP 122/86   Pulse 74   Temp 98.2 F (36.8 C) (Oral)   Ht 5\' 10"  (1.778 m)   Wt 267 lb 6 oz (121.3 kg)   SpO2 99%   BMI 38.36 kg/m  General:  well developed, well nourished, in no apparent distress Skin:  no significant moles, warts, or growths Head:  no masses, lesions, or tenderness Eyes:  pupils equal and round, sclera anicteric without injection Ears:  canals without lesions, TMs shiny without retraction, no obvious effusion, no erythema Nose:  nares patent, septum midline, mucosa normal Throat/Pharynx:  lips and gingiva without lesion; tongue and uvula midline; non-inflamed pharynx; no exudates or postnasal drainage Neck:  neck supple without adenopathy, thyromegaly, or masses Lungs:  clear to auscultation, breath sounds equal bilaterally, no respiratory distress Cardio:  regular rate and rhythm, no bruits, no LE edema Abdomen:  abdomen soft, nontender; bowel sounds normal; no masses or organomegaly Rectal: Deferred Musculoskeletal:  symmetrical muscle groups noted without atrophy or deformity Extremities:  no clubbing, cyanosis, or edema, no deformities, no skin discoloration Neuro:  gait normal; deep tendon reflexes normal and symmetric Psych: well oriented with normal range of affect and appropriate judgment/insight  Assessment and Plan  Well adult exam - Plan: CBC, Comprehensive metabolic panel, Lipid panel  Screen for colon cancer - Plan: Ambulatory referral  to Gastroenterology  Screening for prostate cancer - Plan: PSA   Well 50 y.o. male. Counseled on diet and exercise. Counseled on risks and benefits of prostate cancer screening with PSA. The patient agrees to undergo screening.  Other orders as above. Flu shot rec'd mid Oct.  CCS as above.  Follow up in 1 year pending the above workup. The patient voiced understanding and agreement to the plan.  Jilda Roche [place], DO 04/18/21 8:37 AM

## 2021-04-18 NOTE — Patient Instructions (Addendum)
Give Ronald Hansen 2-3 business days to get the results of your labs back.   Keep the diet clean and stay active.  I recommend getting the flu shot in mid October. This suggestion would change if the CDC comes out with a different recommendation.   If you do not hear anything about your referral in the next 1-2 weeks, call our office and ask for an update.  Consider calling your insurance to see if insurance will cover an irritated skin tag removal.  Let Ronald Hansen know if you need anything.

## 2021-04-24 ENCOUNTER — Other Ambulatory Visit: Payer: Self-pay | Admitting: Family Medicine

## 2021-04-26 ENCOUNTER — Other Ambulatory Visit: Payer: Self-pay

## 2021-04-26 ENCOUNTER — Encounter (INDEPENDENT_AMBULATORY_CARE_PROVIDER_SITE_OTHER): Payer: Self-pay | Admitting: Family Medicine

## 2021-04-26 ENCOUNTER — Ambulatory Visit (INDEPENDENT_AMBULATORY_CARE_PROVIDER_SITE_OTHER): Payer: BC Managed Care – PPO | Admitting: Family Medicine

## 2021-04-26 VITALS — BP 150/102 | HR 65 | Temp 97.7°F | Ht 69.0 in | Wt 260.0 lb

## 2021-04-26 DIAGNOSIS — R0602 Shortness of breath: Secondary | ICD-10-CM | POA: Diagnosis not present

## 2021-04-26 DIAGNOSIS — M25569 Pain in unspecified knee: Secondary | ICD-10-CM

## 2021-04-26 DIAGNOSIS — Z1331 Encounter for screening for depression: Secondary | ICD-10-CM

## 2021-04-26 DIAGNOSIS — R5383 Other fatigue: Secondary | ICD-10-CM

## 2021-04-26 DIAGNOSIS — I10 Essential (primary) hypertension: Secondary | ICD-10-CM

## 2021-04-26 DIAGNOSIS — G8929 Other chronic pain: Secondary | ICD-10-CM

## 2021-04-26 DIAGNOSIS — Z9189 Other specified personal risk factors, not elsewhere classified: Secondary | ICD-10-CM

## 2021-04-26 DIAGNOSIS — E7849 Other hyperlipidemia: Secondary | ICD-10-CM | POA: Diagnosis not present

## 2021-04-26 DIAGNOSIS — R7301 Impaired fasting glucose: Secondary | ICD-10-CM

## 2021-04-26 DIAGNOSIS — Z6838 Body mass index (BMI) 38.0-38.9, adult: Secondary | ICD-10-CM

## 2021-04-26 NOTE — Patient Instructions (Signed)
The 10-year ASCVD risk score (Arnett DK, et al., 2019) is: 4.5%   Values used to calculate the score:     Age: 50 years     Sex: Male     Is Non-Hispanic African American: No     Diabetic: No     Tobacco smoker: No     Systolic Blood Pressure: 150 mmHg     Is BP treated: No     HDL Cholesterol: 46.7 mg/dL     Total Cholesterol: 230 mg/dL

## 2021-04-26 NOTE — Progress Notes (Signed)
Dear Dr. Carmelia Roller,   Thank you for referring Ronald Hansen to our clinic. The following note includes my evaluation and treatment recommendations.  Chief Complaint:   OBESITY Ronald Hansen (MR# [id number]) is a 50 y.o. male who presents for evaluation and treatment of obesity and related comorbidities. Current BMI is Body mass index is 38.4 kg/m. Ronald Hansen has been struggling with his weight for many years and has been unsuccessful in either losing weight, maintaining weight loss, or reaching his healthy weight goal.  Ronald Hansen is currently in the action stage of change and ready to dedicate time achieving and maintaining a healthier weight. Ronald Hansen is interested in becoming our patient and working on intensive lifestyle modifications including (but not limited to) diet and exercise for weight loss.  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 He has a wife, Ronald Hansen, 48 years old. He craves sweets, cheese, sweet/salty, peanut butter, and pizza. Pt snacks on ice cream, salty sweets. He never had trouble losing weight, but difficult to maintain his weight loss. He did it with Mediterranean diet and exercise everyday.  Ronald Hansen's habits were reviewed today and are as follows: His family eats meals together, he thinks his family will eat healthier with him, his desired weight loss is 30-40 lbs, he has been heavy most of his life, he started gaining weight when he was out of work due to accidents, his heaviest weight ever was 295 pounds, he has significant food cravings issues, he skips meals frequently, he is frequently drinking liquids with calories, he frequently makes poor food choices, he frequently eats larger portions than normal, and he struggles with emotional eating.  Depression Screen Ronald Hansen's Food and Mood (modified PHQ-9) score was 6.  Depression screen Ronald Hansen 2/9 04/26/2021  Decreased Interest 1  Down, Depressed, Hopeless 0  PHQ - 2 Score 1  Altered  sleeping 1  Tired, decreased energy 1  Change in appetite 1  Feeling bad or failure about yourself  0  Trouble concentrating 1  Moving slowly or fidgety/restless 1  Suicidal thoughts 0  PHQ-9 Score 6  Difficult doing work/chores Not difficult at all   Subjective:   1. Other fatigue Bensyn admits to daytime somnolence and denies waking up still tired. Patent has a history of symptoms of daytime fatigue. Ronald Hansen generally gets  7.5-8.5  hours of sleep per night, and states that he has generally restful sleep. Snoring is present. Apneic episodes are not present. Pt has a history of OSA. He used a mask many years ago and was told he didn't need it any longer. Epworth Sleepiness Score is 4.  2. Shortness of breath on exertion Ronald Hansen notes increasing shortness of breath with exercising and seems to be worsening over time with weight gain. He notes getting out of breath sooner with activity than he used to. This has gotten worse recently. Ronald Hansen denies shortness of breath at rest or orthopnea.  3. Chronic knee pain, unspecified laterality Bilateral knee pain- status post surgery in both knees. He is managed by [facility] Valentina [facility] and had a recent injection last week. Medication: Celebrex  4. Other hyperlipidemia Ronald Hansen has been off Crestor for 2 months. History of elevated LDL and triglycerides. Medication: None.  5. Impaired fasting glucose Medication: None  6. Essential hypertension Thaer has not been on medication for several years. His BP is usually 120-130/80's at home. He denies symptoms or concerns today. Medication: None.   7. At risk for heart disease Ronald Hansen is  at a higher than average risk for cardiovascular disease due to elevated LDL and elevated BP.   Assessment/Plan:   Orders Placed This Encounter  Procedures   Vitamin B12   Hemoglobin A1c   Insulin, random   TSH   T4, free   VITAMIN D 25 Hydroxy (Vit-D Deficiency, Fractures)   EKG  12-Lead    There are no discontinued medications.   No orders of the defined types were placed in this encounter.    1. Other fatigue Ronald Hansen does feel that his weight is causing his energy to be lower than it should be. Fatigue may be related to obesity, depression or many other causes. Labs will be ordered, and in the meanwhile, Ronald Hansen will focus on self care including making healthy food choices, increasing physical activity and focusing on stress reduction. Check labs today.  - Vitamin B12 - EKG 12-Lead - TSH - T4, free  2. Shortness of breath on exertion Aland does feel that he gets out of breath more easily that he used to when he exercises. Daysen's shortness of breath appears to be obesity related and exercise induced. He has agreed to work on weight loss and gradually increase exercise to treat his exercise induced shortness of breath. Will continue to monitor closely.  3. Chronic knee pain, unspecified laterality Continue treatment plan, per Ortho. Focus on prudent nutritional plan and weight loss.  4. Other hyperlipidemia Cardiovascular risk and specific lipid/LDL goals reviewed.  We discussed several lifestyle modifications today and Sarthak will continue to work on diet, exercise and weight loss efforts. Orders and follow up as documented in patient record. PCP scheduled Coronary Ca score testing for the near future.  Counseling Intensive lifestyle modifications are the first line treatment for this issue. Dietary changes: Increase soluble fiber. Decrease simple carbohydrates. Exercise changes: Moderate to vigorous-intensity aerobic activity 150 minutes per week if tolerated. Lipid-lowering medications: see documented in medical record.  5. Impaired fasting glucose Fasting labs will be obtained and results with be discussed with Ronald Hansen in 2 weeks at his follow up visit. In the meanwhile Ronald Hansen was started on a lower simple carbohydrate  diet and will work on weight loss efforts.  - Hemoglobin A1c - Insulin, random - VITAMIN D 25 Hydroxy (Vit-D Deficiency, Fractures)  6. Essential hypertension Ronald Hansen is working on healthy weight loss and exercise to improve blood pressure control. We will watch for signs of hypotension as he continues his lifestyle modifications. Continue to monitor BP at home 3 times a week and decrease salt intake.  7. Screening for depression Yamato had a positive depression screening. Depression is commonly associated with obesity and often results in emotional eating behaviors. We will monitor this closely and work on CBT to help improve the non-hunger eating patterns. Referral to Psychology may be required if no improvement is seen as he continues in our clinic.  8. At risk for heart disease Kaesen was given approximately 23 minutes of coronary artery disease prevention counseling today. He is 50 y.o. male and has risk factors for heart disease including obesity. We discussed intensive lifestyle modifications today with an emphasis on specific weight loss instructions and strategies.   Repetitive spaced learning was employed today to elicit superior memory formation and behavioral change.  9. Obesity with current BMI of 38.5  Skylen is currently in the action stage of change and his goal is to continue with weight loss efforts. I recommend Ebubechukwu begin the structured treatment plan as follows:  He has agreed  to the Category 3 Plan.  Exercise goals:  As is    Behavioral modification strategies: increasing lean protein intake, decreasing simple carbohydrates, meal planning and cooking strategies, and planning for success.  He was informed of the importance of frequent follow-up visits to maximize his success with intensive lifestyle modifications for his multiple health conditions. He was informed we would discuss his lab results at his next visit unless there is a critical  issue that needs to be addressed sooner. Jermayne agreed to keep his next visit at the agreed upon time to discuss these results.  Objective:   Blood pressure (!) 150/102, pulse 65, temperature 97.7 F (36.5 C), height 5\' 9"  (1.753 m), weight 260 lb (117.9 kg), SpO2 97 %. Body mass index is 38.4 kg/m.  EKG: Abnormal- sinus rhythm, rate 68- consider old anterior infarct.  Indirect Calorimeter completed today shows a VO2 of 288 and a REE of 1987.  His calculated basal metabolic rate is thus his basal metabolic rate is worse than expected.  General: Cooperative, alert, well developed, in no acute distress. HEENT: Conjunctivae and lids unremarkable. Cardiovascular: Regular rhythm.  Lungs: Normal work of breathing. Neurologic: No focal deficits.   Lab Results  Component Value Date   CREATININE 0.72 04/18/2021   BUN 13 04/18/2021   NA 140 04/18/2021   K 4.0 04/18/2021   CL 103 04/18/2021   CO2 28 04/18/2021   Lab Results  Component Value Date   ALT 25 04/18/2021   AST 17 04/18/2021   ALKPHOS 86 04/18/2021   BILITOT 0.5 04/18/2021   Lab Results  Component Value Date   HGBA1C 5.4 04/15/2020   HGBA1C 5.5 03/21/2016   No results found for: INSULIN No results found for: TSH Lab Results  Component Value Date   CHOL 230 (H) 04/18/2021   HDL 46.70 04/18/2021   LDLCALC 151 (H) 04/18/2021   TRIG 162.0 (H) 04/18/2021   CHOLHDL 5 04/18/2021   Lab Results  Component Value Date   WBC 6.3 04/18/2021   HGB 16.5 04/18/2021   HCT 48.9 04/18/2021   MCV 90.8 04/18/2021   PLT 208.0 04/18/2021    Attestation Statements:   Reviewed by clinician on day of visit: allergies, medications, problem list, medical history, surgical history, family history, social history, and previous encounter notes.  04/20/2021, CMA, am acting as transcriptionist for Edmund Hilda, DO.  I have reviewed the above documentation for accuracy and completeness, and I agree with the above. [employer], D.O.  The 21st Century Cures Act was signed into law in 2016 which includes the topic of electronic health records.  This provides immediate access to information in MyChart.  This includes consultation notes, operative notes, office notes, lab results and pathology reports.  If you have any questions about what you read please let 2017 know at your next visit so we can discuss your concerns and take corrective action if need be.  We are right here with you.

## 2021-04-27 LAB — VITAMIN B12: Vitamin B-12: 625 pg/mL (ref 232–1245)

## 2021-04-27 LAB — TSH: TSH: 1.37 u[IU]/mL (ref 0.450–4.500)

## 2021-04-27 LAB — T4, FREE: Free T4: 1.75 ng/dL (ref 0.82–1.77)

## 2021-04-27 LAB — HEMOGLOBIN A1C
Est. average glucose Bld gHb Est-mCnc: 117 mg/dL
Hgb A1c MFr Bld: 5.7 % — ABNORMAL HIGH (ref 4.8–5.6)

## 2021-04-27 LAB — VITAMIN D 25 HYDROXY (VIT D DEFICIENCY, FRACTURES): Vit D, 25-Hydroxy: 16.8 ng/mL — ABNORMAL LOW (ref 30.0–100.0)

## 2021-04-27 LAB — INSULIN, RANDOM: INSULIN: 9 u[IU]/mL (ref 2.6–24.9)

## 2021-05-10 ENCOUNTER — Encounter (INDEPENDENT_AMBULATORY_CARE_PROVIDER_SITE_OTHER): Payer: Self-pay | Admitting: Family Medicine

## 2021-05-10 ENCOUNTER — Ambulatory Visit (INDEPENDENT_AMBULATORY_CARE_PROVIDER_SITE_OTHER): Payer: BC Managed Care – PPO | Admitting: Family Medicine

## 2021-05-10 ENCOUNTER — Other Ambulatory Visit: Payer: Self-pay

## 2021-05-10 VITALS — BP 140/88 | HR 69 | Temp 97.9°F | Ht 69.0 in | Wt 254.0 lb

## 2021-05-10 DIAGNOSIS — E559 Vitamin D deficiency, unspecified: Secondary | ICD-10-CM | POA: Diagnosis not present

## 2021-05-10 DIAGNOSIS — I10 Essential (primary) hypertension: Secondary | ICD-10-CM | POA: Diagnosis not present

## 2021-05-10 DIAGNOSIS — Z6838 Body mass index (BMI) 38.0-38.9, adult: Secondary | ICD-10-CM

## 2021-05-10 DIAGNOSIS — E7849 Other hyperlipidemia: Secondary | ICD-10-CM

## 2021-05-10 DIAGNOSIS — Z9189 Other specified personal risk factors, not elsewhere classified: Secondary | ICD-10-CM

## 2021-05-10 DIAGNOSIS — E66812 Obesity, class 2: Secondary | ICD-10-CM

## 2021-05-10 DIAGNOSIS — R7303 Prediabetes: Secondary | ICD-10-CM | POA: Diagnosis not present

## 2021-05-10 MED ORDER — VITAMIN D (ERGOCALCIFEROL) 1.25 MG (50000 UNIT) PO CAPS
50000.0000 [IU] | ORAL_CAPSULE | ORAL | 0 refills | Status: AC
Start: 2021-05-10 — End: ?

## 2021-05-10 NOTE — Patient Instructions (Signed)
The 10-year ASCVD risk score (Arnett DK, et al., 2019) is: 4%   Values used to calculate the score:     Age: 50 years     Sex: Male     Is Non-Hispanic African American: No     Diabetic: No     Tobacco smoker: No     Systolic Blood Pressure: 140 mmHg     Is BP treated: No     HDL Cholesterol: 46.7 mg/dL     Total Cholesterol: 230 mg/dL

## 2021-05-11 NOTE — Progress Notes (Signed)
Chief Complaint:   OBESITY Ronald Hansen is here to discuss his progress with his obesity treatment plan along with follow-up of his obesity related diagnoses. Ronald Hansen is on the Category 3 Plan and states he is following his eating plan approximately 90% of the time. [name] states he is not currently exercising.  Today's visit was #: 2 Starting weight: 260 lbs Starting date: 04/26/2021 Today's weight: 254 lbs Today's date: 05/10/2021 Total lbs lost to date: 6 Total lbs lost since last in-office visit: 6  Interim History: Ronald Hansen is here today for his first follow-up office visit since starting the program with [place].  All blood work/ lab tests that were recently ordered by myself or an outside provider were reviewed with patient today per their request.   Extended time was spent counseling him on all new disease processes that were discovered or preexisting ones that are affected by BMI.  he understands that many of these abnormalities will need to monitored regularly along with the current treatment plan of prudent dietary changes, in which we are making each and every office visit, to improve these health parameters. STUART MIRABILE is here for a follow up office visit.  We reviewed his meal plan and questions were answered.  Patient's food recall appears to be accurate and consistent with what is on plan when he is following it.   When eating on plan, his hunger and cravings are well controlled.   Pt is weighing his proteins and measuring vegetables.  Subjective:   1. Essential hypertension BP elevated today but much improved from prior. Readings at home run 130's/80's. Medication: None.   BP Readings from Last 3 Encounters:  05/10/21 140/88  04/26/21 (!) 150/102  04/18/21 122/86   Lab Results  Component Value Date   CREATININE 0.72 04/18/2021   CREATININE 0.82 04/15/2020   CREATININE 0.75 03/17/2019   2. Other hyperlipidemia [name] is scheduled  for coronary calcium scan.   The 10-year ASCVD risk score (Arnett DK, et al., 2019) is: 4%   Values used to calculate the score:     Age: 42 years     Sex: Male     Is Non-Hispanic African American: No     Diabetic: No     Tobacco smoker: No     Systolic Blood Pressure: 140 mmHg     Is BP treated: No     HDL Cholesterol: 46.7 mg/dL     Total Cholesterol: 230 mg/dL  3. Pre-diabetes New. Discussed labs with patient today. [name] has a diagnosis of prediabetes based on his elevated HgA1c and was informed this puts him at greater risk of developing diabetes. He continues to work on diet and exercise to decrease his risk of diabetes. He denies nausea or hypoglycemia.  Lab Results  Component Value Date   HGBA1C 5.7 (H) 04/26/2021   Lab Results  Component Value Date   INSULIN 9.0 04/26/2021   4. Vitamin D deficiency He is currently taking no vitamin D supplement. He denies nausea, vomiting or muscle weakness.  Lab Results  Component Value Date   VD25OH 16.8 (L) 04/26/2021   5. At risk for diabetes mellitus Ronald Hansen is at higher than average risk for developing diabetes due to pre-diabetes diagnosis.   Assessment/Plan:  No orders of the defined types were placed in this encounter.   There are no discontinued medications.   Meds ordered this encounter  Medications   Vitamin D, Ergocalciferol, (DRISDOL) 1.25 MG (50000 UNIT) CAPS  capsule    Sig: Take 1 capsule (50,000 Units total) by mouth every 7 (seven) days.    Dispense:  4 capsule    Refill:  0    30 d supply;  ** OV for RF **   Do not send RF request     1. Essential hypertension Ronald Hansen is working on healthy weight loss and exercise to improve blood pressure control. We will watch for signs of hypotension as he continues his lifestyle modifications. Decrease salt intake and focus on prudent nutritional plan and weight loss.   2. Other hyperlipidemia Cardiovascular risk and specific lipid/LDL goals  reviewed.  We discussed several lifestyle modifications today and [name] will continue to work on diet, exercise and weight loss efforts. Orders and follow up as documented in patient record. Follow up with PCP regarding coronary calcium score test. Continue prudent nutritional plan with decreasing saturated and trans fats.   Counseling Intensive lifestyle modifications are the first line treatment for this issue. Dietary changes: Increase soluble fiber. Decrease simple carbohydrates. Exercise changes: Moderate to vigorous-intensity aerobic activity 150 minutes per week if tolerated. Lipid-lowering medications: see documented in medical record.  3. Pre-diabetes Ronald Hansen will continue prudent nutritional plan, work on weight loss, exercise, and decreasing simple carbohydrates to help decrease the risk of diabetes.   4. Vitamin D deficiency Low Vitamin D level contributes to fatigue and are associated with obesity, breast, and colon cancer. He agrees to start prescription Vitamin D 50,000 IU every week and will follow-up for routine testing of Vitamin D, at least 2-3 times per year to avoid over-replacement.  Start- Vitamin D, Ergocalciferol, (DRISDOL) 1.25 MG (50000 UNIT) CAPS capsule; Take 1 capsule (50,000 Units total) by mouth every 7 (seven) days.  Dispense: 4 capsule; Refill: 0  5. At risk for diabetes mellitus - [name] was given diabetes prevention education and counseling today of more than 23 minutes.  - Counseled patient on pathophysiology of disease and meaning/ implication of lab results.  - Reviewed how certain foods can either stimulate or inhibit insulin release, and subsequently affect hunger pathways  - Importance of following a healthy meal plan with limiting amounts of simple carbohydrates discussed with patient - Effects of regular aerobic exercise on blood sugar regulation reviewed and encouraged an eventual goal of 30 min 5d/week or more as a minimum.  - Briefly  discussed treatment options, which always include dietary and lifestyle modification as first line.   - Handouts provided at patient's desire and/or told to go online to the American Diabetes Association website for further information.  6. Obesity with current BMI of 37.6  Ronald Hansen is currently in the action stage of change. As such, his goal is to continue with weight loss efforts. He has agreed to the Category 3 Plan.   Exercise goals:  As is  Behavioral modification strategies: decreasing simple carbohydrates, decreasing sodium intake, better snacking choices, and planning for success.  Jeanmarc has agreed to follow-up with our clinic in 2 weeks. He was informed of the importance of frequent follow-up visits to maximize his success with intensive lifestyle modifications for his multiple health conditions.   Objective:   Blood pressure 140/88, pulse 69, temperature 97.9 F (36.6 C), height 5\' 9"  (1.753 m), weight 254 lb (115.2 kg), SpO2 98 %. Body mass index is 37.51 kg/m.  General: Cooperative, alert, well developed, in no acute distress. HEENT: Conjunctivae and lids unremarkable. Cardiovascular: Regular rhythm.  Lungs: Normal work of breathing. Neurologic: No focal deficits.  Lab Results  Component Value Date   CREATININE 0.72 04/18/2021   BUN 13 04/18/2021   NA 140 04/18/2021   K 4.0 04/18/2021   CL 103 04/18/2021   CO2 28 04/18/2021   Lab Results  Component Value Date   ALT 25 04/18/2021   AST 17 04/18/2021   ALKPHOS 86 04/18/2021   BILITOT 0.5 04/18/2021   Lab Results  Component Value Date   HGBA1C 5.7 (H) 04/26/2021   HGBA1C 5.4 04/15/2020   HGBA1C 5.5 03/21/2016   Lab Results  Component Value Date   INSULIN 9.0 04/26/2021   Lab Results  Component Value Date   TSH 1.370 04/26/2021   Lab Results  Component Value Date   CHOL 230 (H) 04/18/2021   HDL 46.70 04/18/2021   LDLCALC 151 (H) 04/18/2021   TRIG 162.0 (H) 04/18/2021   CHOLHDL 5  04/18/2021   Lab Results  Component Value Date   VD25OH 16.8 (L) 04/26/2021   Lab Results  Component Value Date   WBC 6.3 04/18/2021   HGB 16.5 04/18/2021   HCT 48.9 04/18/2021   MCV 90.8 04/18/2021   PLT 208.0 04/18/2021   Attestation Statements:   Reviewed by clinician on day of visit: allergies, medications, problem list, medical history, surgical history, family history, social history, and previous encounter notes.  Edmund Hilda, CMA, am acting as transcriptionist for [employer], DO.  I have reviewed the above documentation for accuracy and completeness, and I agree with the above. Carlye Grippe, D.O.  The 21st Century Cures Act was signed into law in 2016 which includes the topic of electronic health records.  This provides immediate access to information in MyChart.  This includes consultation notes, operative notes, office notes, lab results and pathology reports.  If you have any questions about what you read please let us know at your next visit so we can discuss your concerns and take corrective action if need be.  We are right here with you.

## 2021-05-25 ENCOUNTER — Ambulatory Visit (INDEPENDENT_AMBULATORY_CARE_PROVIDER_SITE_OTHER): Payer: BC Managed Care – PPO | Admitting: Bariatrics

## 2021-06-07 ENCOUNTER — Encounter (INDEPENDENT_AMBULATORY_CARE_PROVIDER_SITE_OTHER): Payer: Self-pay

## 2021-06-07 ENCOUNTER — Ambulatory Visit (INDEPENDENT_AMBULATORY_CARE_PROVIDER_SITE_OTHER): Payer: BC Managed Care – PPO | Admitting: Family Medicine

## 2021-08-16 ENCOUNTER — Other Ambulatory Visit: Payer: Self-pay | Admitting: Family Medicine

## 2021-09-15 ENCOUNTER — Other Ambulatory Visit: Payer: Self-pay | Admitting: Family Medicine

## 2022-03-14 ENCOUNTER — Encounter (INDEPENDENT_AMBULATORY_CARE_PROVIDER_SITE_OTHER): Payer: Self-pay

## 2022-04-20 ENCOUNTER — Encounter: Payer: Self-pay | Admitting: Family Medicine
